# Patient Record
Sex: Female | Born: 1935 | Race: White | Hispanic: No | Smoking: Never smoker
Health system: Southern US, Community
[De-identification: ages and names within clinical notes are randomized; demographics above are authoritative.]

## PROBLEM LIST (undated history)

## (undated) DIAGNOSIS — M810 Age-related osteoporosis without current pathological fracture: Secondary | ICD-10-CM

## (undated) DIAGNOSIS — G459 Transient cerebral ischemic attack, unspecified: Secondary | ICD-10-CM

## (undated) DIAGNOSIS — I679 Cerebrovascular disease, unspecified: Secondary | ICD-10-CM

## (undated) DIAGNOSIS — E785 Hyperlipidemia, unspecified: Secondary | ICD-10-CM

## (undated) DIAGNOSIS — T7840XA Allergy, unspecified, initial encounter: Secondary | ICD-10-CM

## (undated) DIAGNOSIS — M1990 Unspecified osteoarthritis, unspecified site: Secondary | ICD-10-CM

## (undated) DIAGNOSIS — R42 Dizziness and giddiness: Secondary | ICD-10-CM

## (undated) DIAGNOSIS — C801 Malignant (primary) neoplasm, unspecified: Secondary | ICD-10-CM

## (undated) DIAGNOSIS — Z972 Presence of dental prosthetic device (complete) (partial): Secondary | ICD-10-CM

## (undated) HISTORY — DX: Allergy, unspecified, initial encounter: T78.40XA

## (undated) HISTORY — DX: Age-related osteoporosis without current pathological fracture: M81.0

## (undated) HISTORY — DX: Hyperlipidemia, unspecified: E78.5

## (undated) HISTORY — PX: COLONOSCOPY: SHX174

## (undated) HISTORY — PX: CATARACT EXTRACTION W/ INTRAOCULAR LENS  IMPLANT, BILATERAL: SHX1307

## (undated) HISTORY — DX: Cerebrovascular disease, unspecified: I67.9

## (undated) HISTORY — PX: TUBAL LIGATION: SHX77

---

## 2004-09-22 ENCOUNTER — Ambulatory Visit: Payer: Self-pay | Admitting: Gastroenterology

## 2004-10-04 ENCOUNTER — Ambulatory Visit: Payer: Self-pay | Admitting: Internal Medicine

## 2005-10-09 ENCOUNTER — Ambulatory Visit: Payer: Self-pay | Admitting: Internal Medicine

## 2006-07-17 ENCOUNTER — Ambulatory Visit: Payer: Self-pay | Admitting: Internal Medicine

## 2006-10-11 ENCOUNTER — Ambulatory Visit: Payer: Self-pay | Admitting: Internal Medicine

## 2007-10-14 ENCOUNTER — Ambulatory Visit: Payer: Self-pay | Admitting: Internal Medicine

## 2008-08-21 ENCOUNTER — Ambulatory Visit: Payer: Self-pay | Admitting: Internal Medicine

## 2008-10-13 ENCOUNTER — Ambulatory Visit: Payer: Self-pay | Admitting: Neurology

## 2008-10-14 ENCOUNTER — Ambulatory Visit: Payer: Self-pay | Admitting: Internal Medicine

## 2009-10-18 ENCOUNTER — Ambulatory Visit: Payer: Self-pay | Admitting: Internal Medicine

## 2010-10-20 ENCOUNTER — Ambulatory Visit: Payer: Self-pay | Admitting: Internal Medicine

## 2011-10-23 ENCOUNTER — Ambulatory Visit: Payer: Self-pay | Admitting: Internal Medicine

## 2012-10-23 ENCOUNTER — Ambulatory Visit: Payer: Self-pay | Admitting: Internal Medicine

## 2013-09-23 DIAGNOSIS — J309 Allergic rhinitis, unspecified: Secondary | ICD-10-CM | POA: Insufficient documentation

## 2013-09-23 DIAGNOSIS — I679 Cerebrovascular disease, unspecified: Secondary | ICD-10-CM | POA: Insufficient documentation

## 2013-10-27 ENCOUNTER — Ambulatory Visit: Payer: Self-pay | Admitting: Family Medicine

## 2014-11-06 DIAGNOSIS — E782 Mixed hyperlipidemia: Secondary | ICD-10-CM | POA: Insufficient documentation

## 2014-11-06 DIAGNOSIS — M810 Age-related osteoporosis without current pathological fracture: Secondary | ICD-10-CM | POA: Insufficient documentation

## 2014-11-09 ENCOUNTER — Other Ambulatory Visit: Payer: Self-pay | Admitting: Family Medicine

## 2014-11-09 DIAGNOSIS — Z1231 Encounter for screening mammogram for malignant neoplasm of breast: Secondary | ICD-10-CM

## 2014-11-10 ENCOUNTER — Ambulatory Visit
Admission: RE | Admit: 2014-11-10 | Discharge: 2014-11-10 | Disposition: A | Discharge status: Home / Self Care | Payer: PPO | Source: Ambulatory Visit | Attending: Family Medicine | Admitting: Family Medicine

## 2014-11-10 ENCOUNTER — Encounter (INDEPENDENT_AMBULATORY_CARE_PROVIDER_SITE_OTHER): Payer: Self-pay

## 2014-11-10 DIAGNOSIS — Z1231 Encounter for screening mammogram for malignant neoplasm of breast: Secondary | ICD-10-CM | POA: Diagnosis present

## 2014-11-23 ENCOUNTER — Ambulatory Visit (INDEPENDENT_AMBULATORY_CARE_PROVIDER_SITE_OTHER): Payer: PPO | Admitting: Podiatry

## 2014-11-23 ENCOUNTER — Encounter: Payer: Self-pay | Admitting: Podiatry

## 2014-11-23 ENCOUNTER — Ambulatory Visit (INDEPENDENT_AMBULATORY_CARE_PROVIDER_SITE_OTHER): Payer: PPO

## 2014-11-23 VITALS — BP 136/71 | HR 73 | Resp 16 | Ht 63.0 in | Wt 133.0 lb

## 2014-11-23 DIAGNOSIS — S92001A Unspecified fracture of right calcaneus, initial encounter for closed fracture: Secondary | ICD-10-CM

## 2014-11-23 DIAGNOSIS — L600 Ingrowing nail: Secondary | ICD-10-CM | POA: Diagnosis not present

## 2014-11-23 DIAGNOSIS — S92301A Fracture of unspecified metatarsal bone(s), right foot, initial encounter for closed fracture: Secondary | ICD-10-CM | POA: Diagnosis not present

## 2014-11-23 MED ORDER — NEOMYCIN-POLYMYXIN-HC 1 % OT SOLN: OTIC | Status: DC

## 2014-11-23 NOTE — Progress Notes (Signed)
   Subjective:    Patient ID: Laura Ramos, female    DOB: 01-31-1933, 81 y.o.   MRN: [id number]  HPI  Did something to my right foot about a week ago Sunday.  The lateral side of foot started to hurt and i could barely walk out of church. It bruised up around the ankle . It is better now . Before that happened the top of my right foot was sore . She is also complaining of a painful ingrown toenail to the tibial border of the hallux left. She states it is been like this for quite some time and a need to have that margin removed.  Review of Systems     Objective:   Physical Exam: I have reviewed her past medical history medications allergies chief complaint past surgical history and review of systems. Pulses are strongly palpable bilateral. Neurologic sensorium is intact per Semmes-Weinstein monofilament. Deep tendon reflexes are intact bilateral and muscle strength +5 over 5 dorsiflexion plantar flexors and inverters everters all intrinsic musculature is intact. Orthopedic evaluation demonstrates ecchymosis and edema overlying the sinus tarsi area of the right foot. Radiographs demonstrate what appears to be a minimally comminuted displaced fracture of the anterior process of the calcaneus right foot. Left foot demonstrates a sharp radial nail margin with erythema and edema to the tibial border of the hallux left. No purulence and no malodor.        Assessment & Plan:  Assessment: Nondisplaced non-comminuted fracture anterior process right calcaneus. Ingrown nail paronychia abscess hallux left.  Plan: Discussed etiology pathology conservative versus surgical therapies. Encouraged her to continue to wear good supportive shoe gear to the right foot. I suggested that she not go barefooted or wear flip flops or sandals. We performed a chemical matrixectomy to the tibial border of the hallux left today. She tolerated this procedure well after local anesthesia was administered. We then split the  nail from distal proximal and avulsed in total 3 applications of phenol were applied to the nailbed and the root. She was given both I will read home-going instructions for the care and soaking of her toe and I will follow-up with her in 1 week for reevaluation.

## 2014-11-23 NOTE — Patient Instructions (Signed)

## 2014-12-02 ENCOUNTER — Ambulatory Visit (INDEPENDENT_AMBULATORY_CARE_PROVIDER_SITE_OTHER): Payer: PPO | Admitting: Podiatry

## 2014-12-02 DIAGNOSIS — S92001A Unspecified fracture of right calcaneus, initial encounter for closed fracture: Secondary | ICD-10-CM

## 2014-12-02 DIAGNOSIS — L600 Ingrowing nail: Secondary | ICD-10-CM

## 2014-12-02 NOTE — Progress Notes (Signed)
She presents today for follow-up of her matrixectomy tibial border hallux left. She states that her right foot seems to be doing much better. She continues to soak twice daily and Betadine and water apply quarter screws for an otic twice daily and cover with a Band-Aid.  Objective: Vital signs are stable she is alert and oriented 3. Pulses are palpable. Surgical toe does not demonstrate any erythema cellulitis drainage or odor. She has minimal tenderness on palpation of the right foot.  Assessment: Well-healed surgical toe left.  Plan: Discontinue Betadine Stow with Epsom salts and warm water soaks covered in the daily open at night.

## 2014-12-02 NOTE — Patient Instructions (Signed)

## 2014-12-23 ENCOUNTER — Ambulatory Visit: Payer: PPO | Admitting: Podiatry

## 2015-05-04 DIAGNOSIS — Z79899 Other long term (current) drug therapy: Secondary | ICD-10-CM | POA: Diagnosis not present

## 2015-05-04 DIAGNOSIS — E7800 Pure hypercholesterolemia, unspecified: Secondary | ICD-10-CM | POA: Diagnosis not present

## 2015-05-13 DIAGNOSIS — J302 Other seasonal allergic rhinitis: Secondary | ICD-10-CM | POA: Diagnosis not present

## 2015-05-13 DIAGNOSIS — Z79899 Other long term (current) drug therapy: Secondary | ICD-10-CM | POA: Diagnosis not present

## 2015-05-13 DIAGNOSIS — E7800 Pure hypercholesterolemia, unspecified: Secondary | ICD-10-CM | POA: Diagnosis not present

## 2015-05-13 DIAGNOSIS — I679 Cerebrovascular disease, unspecified: Secondary | ICD-10-CM | POA: Diagnosis not present

## 2015-07-01 DIAGNOSIS — C44629 Squamous cell carcinoma of skin of left upper limb, including shoulder: Secondary | ICD-10-CM | POA: Diagnosis not present

## 2015-07-01 DIAGNOSIS — L570 Actinic keratosis: Secondary | ICD-10-CM | POA: Diagnosis not present

## 2015-07-01 DIAGNOSIS — C44622 Squamous cell carcinoma of skin of right upper limb, including shoulder: Secondary | ICD-10-CM | POA: Diagnosis not present

## 2015-08-05 DIAGNOSIS — L570 Actinic keratosis: Secondary | ICD-10-CM | POA: Diagnosis not present

## 2015-08-05 DIAGNOSIS — D0462 Carcinoma in situ of skin of left upper limb, including shoulder: Secondary | ICD-10-CM | POA: Diagnosis not present

## 2015-08-05 DIAGNOSIS — D485 Neoplasm of uncertain behavior of skin: Secondary | ICD-10-CM | POA: Diagnosis not present

## 2015-08-05 DIAGNOSIS — X32XXXA Exposure to sunlight, initial encounter: Secondary | ICD-10-CM | POA: Diagnosis not present

## 2015-08-11 DIAGNOSIS — R399 Unspecified symptoms and signs involving the genitourinary system: Secondary | ICD-10-CM | POA: Diagnosis not present

## 2015-08-18 DIAGNOSIS — H04123 Dry eye syndrome of bilateral lacrimal glands: Secondary | ICD-10-CM | POA: Diagnosis not present

## 2015-09-09 DIAGNOSIS — L905 Scar conditions and fibrosis of skin: Secondary | ICD-10-CM | POA: Diagnosis not present

## 2015-09-09 DIAGNOSIS — D0462 Carcinoma in situ of skin of left upper limb, including shoulder: Secondary | ICD-10-CM | POA: Diagnosis not present

## 2015-09-23 DIAGNOSIS — W57XXXA Bitten or stung by nonvenomous insect and other nonvenomous arthropods, initial encounter: Secondary | ICD-10-CM | POA: Diagnosis not present

## 2015-09-23 DIAGNOSIS — S70362A Insect bite (nonvenomous), left thigh, initial encounter: Secondary | ICD-10-CM | POA: Diagnosis not present

## 2015-09-23 DIAGNOSIS — S20162A Insect bite (nonvenomous) of breast, left breast, initial encounter: Secondary | ICD-10-CM | POA: Diagnosis not present

## 2015-10-07 DIAGNOSIS — R509 Fever, unspecified: Secondary | ICD-10-CM | POA: Diagnosis not present

## 2015-10-07 DIAGNOSIS — W57XXXD Bitten or stung by nonvenomous insect and other nonvenomous arthropods, subsequent encounter: Secondary | ICD-10-CM | POA: Diagnosis not present

## 2015-10-07 DIAGNOSIS — S20162D Insect bite (nonvenomous) of breast, left breast, subsequent encounter: Secondary | ICD-10-CM | POA: Diagnosis not present

## 2015-11-01 DIAGNOSIS — E7800 Pure hypercholesterolemia, unspecified: Secondary | ICD-10-CM | POA: Diagnosis not present

## 2015-11-01 DIAGNOSIS — Z79899 Other long term (current) drug therapy: Secondary | ICD-10-CM | POA: Diagnosis not present

## 2015-11-08 DIAGNOSIS — Z79899 Other long term (current) drug therapy: Secondary | ICD-10-CM | POA: Diagnosis not present

## 2015-11-08 DIAGNOSIS — Z0000 Encounter for general adult medical examination without abnormal findings: Secondary | ICD-10-CM | POA: Diagnosis not present

## 2015-11-08 DIAGNOSIS — M810 Age-related osteoporosis without current pathological fracture: Secondary | ICD-10-CM | POA: Diagnosis not present

## 2015-11-08 DIAGNOSIS — E7800 Pure hypercholesterolemia, unspecified: Secondary | ICD-10-CM | POA: Diagnosis not present

## 2015-11-09 ENCOUNTER — Other Ambulatory Visit: Payer: Self-pay | Admitting: Family Medicine

## 2015-11-09 DIAGNOSIS — Z1231 Encounter for screening mammogram for malignant neoplasm of breast: Secondary | ICD-10-CM

## 2015-11-15 DIAGNOSIS — M810 Age-related osteoporosis without current pathological fracture: Secondary | ICD-10-CM | POA: Diagnosis not present

## 2015-11-24 ENCOUNTER — Other Ambulatory Visit: Payer: Self-pay | Admitting: Family Medicine

## 2015-11-24 ENCOUNTER — Ambulatory Visit
Admission: RE | Admit: 2015-11-24 | Discharge: 2015-11-24 | Disposition: A | Discharge status: Home / Self Care | Payer: PPO | Source: Ambulatory Visit | Attending: Family Medicine | Admitting: Family Medicine

## 2015-11-24 DIAGNOSIS — Z1231 Encounter for screening mammogram for malignant neoplasm of breast: Secondary | ICD-10-CM | POA: Insufficient documentation

## 2015-11-24 HISTORY — DX: Malignant (primary) neoplasm, unspecified: C80.1

## 2015-12-03 DIAGNOSIS — X32XXXA Exposure to sunlight, initial encounter: Secondary | ICD-10-CM | POA: Diagnosis not present

## 2015-12-03 DIAGNOSIS — L570 Actinic keratosis: Secondary | ICD-10-CM | POA: Diagnosis not present

## 2015-12-03 DIAGNOSIS — Z85828 Personal history of other malignant neoplasm of skin: Secondary | ICD-10-CM | POA: Diagnosis not present

## 2015-12-03 DIAGNOSIS — L821 Other seborrheic keratosis: Secondary | ICD-10-CM | POA: Diagnosis not present

## 2015-12-03 DIAGNOSIS — Z08 Encounter for follow-up examination after completed treatment for malignant neoplasm: Secondary | ICD-10-CM | POA: Diagnosis not present

## 2016-02-29 DIAGNOSIS — M5440 Lumbago with sciatica, unspecified side: Secondary | ICD-10-CM | POA: Diagnosis not present

## 2016-05-01 DIAGNOSIS — E7800 Pure hypercholesterolemia, unspecified: Secondary | ICD-10-CM | POA: Diagnosis not present

## 2016-05-01 DIAGNOSIS — Z79899 Other long term (current) drug therapy: Secondary | ICD-10-CM | POA: Diagnosis not present

## 2016-05-16 DIAGNOSIS — I679 Cerebrovascular disease, unspecified: Secondary | ICD-10-CM | POA: Diagnosis not present

## 2016-05-16 DIAGNOSIS — E7800 Pure hypercholesterolemia, unspecified: Secondary | ICD-10-CM | POA: Diagnosis not present

## 2016-05-16 DIAGNOSIS — G8929 Other chronic pain: Secondary | ICD-10-CM | POA: Diagnosis not present

## 2016-05-16 DIAGNOSIS — M5441 Lumbago with sciatica, right side: Secondary | ICD-10-CM | POA: Diagnosis not present

## 2016-05-16 DIAGNOSIS — Z79899 Other long term (current) drug therapy: Secondary | ICD-10-CM | POA: Diagnosis not present

## 2016-07-14 DIAGNOSIS — J069 Acute upper respiratory infection, unspecified: Secondary | ICD-10-CM | POA: Diagnosis not present

## 2016-08-28 ENCOUNTER — Ambulatory Visit (INDEPENDENT_AMBULATORY_CARE_PROVIDER_SITE_OTHER): Payer: PPO | Admitting: Podiatry

## 2016-08-28 ENCOUNTER — Ambulatory Visit (INDEPENDENT_AMBULATORY_CARE_PROVIDER_SITE_OTHER): Payer: PPO

## 2016-08-28 ENCOUNTER — Encounter: Payer: Self-pay | Admitting: Podiatry

## 2016-08-28 DIAGNOSIS — M7752 Other enthesopathy of left foot: Secondary | ICD-10-CM

## 2016-08-28 DIAGNOSIS — M779 Enthesopathy, unspecified: Principal | ICD-10-CM

## 2016-08-28 DIAGNOSIS — M778 Other enthesopathies, not elsewhere classified: Secondary | ICD-10-CM

## 2016-08-28 DIAGNOSIS — Q828 Other specified congenital malformations of skin: Secondary | ICD-10-CM

## 2016-08-28 NOTE — Progress Notes (Signed)
She presents today chief complaint of painful lesion plantar aspect left foot. She states that he'll not under here and it may be a neuroma. She states it is been painful for a while and different shoes really have failed to alleviate her symptoms.  Objective: Vital signs are stable she's alert and 3 pulses are palpable. Protective hyperkeratotic subsecond is present but nonpainful. I debrided this for her today. I also am able to palpate the lesser metatarsals particularly #3 and number for the left foot are exquisitely tender.  Assessment: Metatarsalgia and porokeratosis.  Plan: Demonstrated to her thoroughly today how to place padding in her shoes to help prevent and offload the pressure of the second metatarsal third and fourth metatarsals. He will use arch pads/met pads.

## 2016-08-30 DIAGNOSIS — H40003 Preglaucoma, unspecified, bilateral: Secondary | ICD-10-CM | POA: Diagnosis not present

## 2016-10-30 DIAGNOSIS — H40003 Preglaucoma, unspecified, bilateral: Secondary | ICD-10-CM | POA: Diagnosis not present

## 2016-11-06 DIAGNOSIS — Z79899 Other long term (current) drug therapy: Secondary | ICD-10-CM | POA: Diagnosis not present

## 2016-11-06 DIAGNOSIS — E7800 Pure hypercholesterolemia, unspecified: Secondary | ICD-10-CM | POA: Diagnosis not present

## 2016-11-06 DIAGNOSIS — H40003 Preglaucoma, unspecified, bilateral: Secondary | ICD-10-CM | POA: Diagnosis not present

## 2016-11-16 DIAGNOSIS — I679 Cerebrovascular disease, unspecified: Secondary | ICD-10-CM | POA: Diagnosis not present

## 2016-11-16 DIAGNOSIS — Z79899 Other long term (current) drug therapy: Secondary | ICD-10-CM | POA: Diagnosis not present

## 2016-11-16 DIAGNOSIS — E7800 Pure hypercholesterolemia, unspecified: Secondary | ICD-10-CM | POA: Diagnosis not present

## 2016-11-16 DIAGNOSIS — J309 Allergic rhinitis, unspecified: Secondary | ICD-10-CM | POA: Diagnosis not present

## 2016-11-30 DIAGNOSIS — Z08 Encounter for follow-up examination after completed treatment for malignant neoplasm: Secondary | ICD-10-CM | POA: Diagnosis not present

## 2016-11-30 DIAGNOSIS — X32XXXA Exposure to sunlight, initial encounter: Secondary | ICD-10-CM | POA: Diagnosis not present

## 2016-11-30 DIAGNOSIS — H02403 Unspecified ptosis of bilateral eyelids: Secondary | ICD-10-CM | POA: Diagnosis not present

## 2016-11-30 DIAGNOSIS — L570 Actinic keratosis: Secondary | ICD-10-CM | POA: Diagnosis not present

## 2016-11-30 DIAGNOSIS — Z85828 Personal history of other malignant neoplasm of skin: Secondary | ICD-10-CM | POA: Diagnosis not present

## 2016-11-30 DIAGNOSIS — L821 Other seborrheic keratosis: Secondary | ICD-10-CM | POA: Diagnosis not present

## 2017-01-04 DIAGNOSIS — W57XXXA Bitten or stung by nonvenomous insect and other nonvenomous arthropods, initial encounter: Secondary | ICD-10-CM | POA: Diagnosis not present

## 2017-01-04 DIAGNOSIS — G44209 Tension-type headache, unspecified, not intractable: Secondary | ICD-10-CM | POA: Diagnosis not present

## 2017-01-04 DIAGNOSIS — L539 Erythematous condition, unspecified: Secondary | ICD-10-CM | POA: Diagnosis not present

## 2017-01-22 DIAGNOSIS — A6929 Other conditions associated with Lyme disease: Secondary | ICD-10-CM | POA: Diagnosis not present

## 2017-04-03 ENCOUNTER — Other Ambulatory Visit: Payer: Self-pay

## 2017-04-03 ENCOUNTER — Encounter: Payer: Self-pay | Admitting: *Deleted

## 2017-04-05 NOTE — Discharge Instructions (Signed)
INSTRUCTIONS FOLLOWING OCULOPLASTIC SURGERY °AMY M. FOWLER, MD ° °AFTER YOUR EYE SURGERY, THER ARE MANY THINGS THWIHC YOU, THE PATIENT, CAN DO TO ASSURE THE BEST POSSIBLE RESULT FROM YOUR OPERATION.  THIS SHEET SHOULD BE REFERRED TO WHENEVER QUESTIONS ARISE.  IF THERE ARE ANY QUESTIONS NOT ANSWERED HERE, DO NOT HESITATE TO CALL OUR OFFICE AT [phone] OR [phone].  THERE IS ALWAYS [facility] AVAILABLE TO CALL IF QUESTIONS OR PROBLEMS ARISE. ° °VISION: Your vision may be blurred and out of focus after surgery until you are able to stop using your ointment, swelling resolves and your eye(s) heal. This may take 1 to 2 weeks at the least.  If your vision becomes gradually more dim or dark, this is not normal and you need to call our office immediately. ° °EYE CARE: For the first 48 hours after surgery, use ice packs frequently - “20 minutes on, 20 minutes off” - to help reduce swelling and bruising.  Small bags of frozen peas or corn make good ice packs along with cloths soaked in ice water.  If you are wearing a patch or other type of dressing following surgery, keep this on for the amount of time specified by your doctor.  For the first week following surgery, you will need to treat your stitches with great care.  If is OK to shower, but take care to not allow soapy water to run into your eye(s) to help reduce changes of infection.  You may gently clean the eyelashes and around the eye(s) with cotton balls and sterile water, BUT DO NOT RUB THE STITCHES VIGOROUSLY.  Keeping your stitches moist with ointment will help promote healing with minimal scar formation. ° °ACTIVITY: When you leave the surgery center, you should go home, rest and be inactive.  The eye(s) may feel scratchy and keeping the eyes closed will allow for faster healing.  The first week following surgery, avoid straining (anything making the face turn red) or lifting over 20 pounds.  Additionally, avoid bending which causes your head to go below  your waist.  Using your eyes will NOT harm them, so feel free to read, watch television, use the computer, etc as desired.  Driving depends on each individual, so check with your doctor if you have questions about driving. ° °MEDICATIONS:  You will be given a prescription for an ointment to use 4 times a day on your stitches.  You can use the ointment in your eyes if they feel scratchy or irritated.  If you eyelid(s) don’t close completely when you sleep, put some ointment in your eyes before bedtime. ° °EMERGENCY: If you experience SEVERE EYE PAIN OR HEADACHE UNRELIEVED BY TYLENOL OR PERCOCET, NAUSEA OR VOMITING, WORSENING REDNESS, OR WORSENING VISION (ESPECIALLY VISION THAT WA INITIALLY BETTER) CALL [phone] OR [phone] DURING BUSINESS HOURS OR AFTER HOURS. ° °General Anesthesia, Adult, Care After °These instructions provide you with information about caring for yourself after your procedure. Your health care provider may also give you more specific instructions. Your treatment has been planned according to current medical practices, but problems sometimes occur. Call your health care provider if you have any problems or questions after your procedure. °What can I expect after the procedure? °After the procedure, it is common to have: °· Vomiting. °· A sore throat. °· Mental slowness. ° °It is common to feel: °· Nauseous. °· Cold or shivery. °· Sleepy. °· Tired. °· Sore or achy, even in parts of your body where you did not have surgery. ° °  Follow these instructions at home: °For at least 24 hours after the procedure: °· Do not: °? Participate in activities where you could fall or become injured. °? Drive. °? Use heavy machinery. °? Drink alcohol. °? Take sleeping pills or medicines that cause drowsiness. °? Make important decisions or sign legal documents. °? Take care of children on your own. °· Rest. °Eating and drinking °· If you vomit, drink water, juice, or soup when you can drink without  vomiting. °· Drink enough fluid to keep your urine clear or pale yellow. °· Make sure you have little or no nausea before eating solid foods. °· Follow the diet recommended by your health care provider. °General instructions °· Have a responsible adult stay with you until you are awake and alert. °· Return to your normal activities as told by your health care provider. Ask your health care provider what activities are safe for you. °· Take over-the-counter and prescription medicines only as told by your health care provider. °· If you smoke, do not smoke without supervision. °· Keep all follow-up visits as told by your health care provider. This is important. °Contact a health care provider if: °· You continue to have nausea or vomiting at home, and medicines are not helpful. °· You cannot drink fluids or start eating again. °· You cannot urinate after 8-12 hours. °· You develop a skin rash. °· You have fever. °· You have increasing redness at the site of your procedure. °Get help right away if: °· You have difficulty breathing. °· You have chest pain. °· You have unexpected bleeding. °· You feel that you are having a life-threatening or urgent problem. °This information is not intended to replace advice given to you by your health care provider. Make sure you discuss any questions you have with your health care provider. °Document Released: 07/17/2000 Document Revised: 09/13/2015 Document Reviewed: 03/25/2015 °Elsevier Interactive Patient Education © 2018 [employer] ° °

## 2017-04-10 ENCOUNTER — Ambulatory Visit: Payer: PPO | Admitting: Anesthesiology

## 2017-04-10 ENCOUNTER — Ambulatory Visit
Admission: RE | Admit: 2017-04-10 | Discharge: 2017-04-10 | Disposition: A | Discharge status: Home / Self Care | Payer: PPO | Source: Ambulatory Visit | Attending: Ophthalmology | Admitting: Ophthalmology

## 2017-04-10 ENCOUNTER — Encounter
Admission: RE | Disposition: A | Discharge status: Home / Self Care | Payer: Self-pay | Source: Ambulatory Visit | Attending: Ophthalmology

## 2017-04-10 DIAGNOSIS — H02403 Unspecified ptosis of bilateral eyelids: Secondary | ICD-10-CM | POA: Insufficient documentation

## 2017-04-10 DIAGNOSIS — Z7951 Long term (current) use of inhaled steroids: Secondary | ICD-10-CM | POA: Diagnosis not present

## 2017-04-10 DIAGNOSIS — Z8673 Personal history of transient ischemic attack (TIA), and cerebral infarction without residual deficits: Secondary | ICD-10-CM | POA: Diagnosis not present

## 2017-04-10 DIAGNOSIS — M810 Age-related osteoporosis without current pathological fracture: Secondary | ICD-10-CM | POA: Insufficient documentation

## 2017-04-10 DIAGNOSIS — H0234 Blepharochalasis left upper eyelid: Secondary | ICD-10-CM | POA: Diagnosis not present

## 2017-04-10 DIAGNOSIS — Z85828 Personal history of other malignant neoplasm of skin: Secondary | ICD-10-CM | POA: Insufficient documentation

## 2017-04-10 DIAGNOSIS — E7800 Pure hypercholesterolemia, unspecified: Secondary | ICD-10-CM | POA: Insufficient documentation

## 2017-04-10 DIAGNOSIS — Z7902 Long term (current) use of antithrombotics/antiplatelets: Secondary | ICD-10-CM | POA: Insufficient documentation

## 2017-04-10 DIAGNOSIS — Z79899 Other long term (current) drug therapy: Secondary | ICD-10-CM | POA: Diagnosis not present

## 2017-04-10 DIAGNOSIS — H0231 Blepharochalasis right upper eyelid: Secondary | ICD-10-CM | POA: Diagnosis not present

## 2017-04-10 HISTORY — DX: Transient cerebral ischemic attack, unspecified: G45.9

## 2017-04-10 HISTORY — DX: Presence of dental prosthetic device (complete) (partial): Z97.2

## 2017-04-10 HISTORY — PX: PTOSIS REPAIR: SHX6568

## 2017-04-10 HISTORY — DX: Dizziness and giddiness: R42

## 2017-04-10 HISTORY — DX: Unspecified osteoarthritis, unspecified site: M19.90

## 2017-04-10 SURGERY — REPAIR, BLEPHAROPTOSIS
Anesthesia: Monitor Anesthesia Care | Site: Eye | Laterality: Bilateral | Wound class: Clean

## 2017-04-10 MED ORDER — ALFENTANIL 500 MCG/ML IJ INJ
INJECTION | INTRAVENOUS | Status: DC | PRN
  Administered 2017-04-10: 200 ug via INTRAVENOUS
  Administered 2017-04-10: 500 ug via INTRAVENOUS

## 2017-04-10 MED ORDER — MIDAZOLAM HCL 2 MG/2ML IJ SOLN
INTRAMUSCULAR | Status: DC | PRN
  Administered 2017-04-10: 2 mg via INTRAVENOUS

## 2017-04-10 MED ORDER — BSS IO SOLN
INTRAOCULAR | Status: DC | PRN
  Administered 2017-04-10: 15 mL

## 2017-04-10 MED ORDER — ERYTHROMYCIN 5 MG/GM OP OINT
TOPICAL_OINTMENT | OPHTHALMIC | 3 refills | Status: DC
Start: 2017-04-10 — End: 2022-06-21

## 2017-04-10 MED ORDER — ERYTHROMYCIN 5 MG/GM OP OINT
TOPICAL_OINTMENT | OPHTHALMIC | Status: DC | PRN
  Administered 2017-04-10: 1 via OPHTHALMIC

## 2017-04-10 MED ORDER — TETRACAINE HCL 0.5 % OP SOLN
OPHTHALMIC | Status: DC | PRN
  Administered 2017-04-10: 2 [drp] via OPHTHALMIC

## 2017-04-10 MED ORDER — LACTATED RINGERS IV SOLN
INTRAVENOUS | Status: DC
  Administered 2017-04-10: 11:00:00 via INTRAVENOUS

## 2017-04-10 MED ORDER — LIDOCAINE-EPINEPHRINE 2 %-1:100000 IJ SOLN
INTRAMUSCULAR | Status: DC | PRN
  Administered 2017-04-10: 2 mL via OPHTHALMIC

## 2017-04-10 MED ORDER — TRAMADOL HCL 50 MG PO TABS: ORAL_TABLET | ORAL | 0 refills | Status: DC

## 2017-04-10 SURGICAL SUPPLY — 26 items
APPLICATOR COTTON TIP WD 3 STR (MISCELLANEOUS) ×6 IMPLANT
BLADE SURG 15 STRL LF DISP TIS (BLADE) ×1 IMPLANT
BLADE SURG 15 STRL SS (BLADE) ×2
CORD BIP STRL DISP 12FT (MISCELLANEOUS) ×3 IMPLANT
DRAPE HEAD BAR (DRAPES) ×3 IMPLANT
GAUZE SPONGE 4X4 12PLY STRL (GAUZE/BANDAGES/DRESSINGS) ×3 IMPLANT
GAUZE SPONGE NON-WVN 2X2 STRL (MISCELLANEOUS) ×10 IMPLANT
GLOVE SURG LX 7.0 MICRO (GLOVE) ×4
GLOVE SURG LX STRL 7.0 MICRO (GLOVE) ×2 IMPLANT
MARKER SKIN XFINE TIP W/RULER (MISCELLANEOUS) ×3 IMPLANT
NEEDLE FILTER BLUNT 18X 1/2SAF (NEEDLE) ×2
NEEDLE FILTER BLUNT 18X1 1/2 (NEEDLE) ×1 IMPLANT
NEEDLE HYPO 30X.5 LL (NEEDLE) ×6 IMPLANT
PACK DRAPE NASAL/ENT (PACKS) ×3 IMPLANT
SOL PREP PVP 2OZ (MISCELLANEOUS) ×3
SOLUTION PREP PVP 2OZ (MISCELLANEOUS) ×1 IMPLANT
SPONGE VERSALON 2X2 STRL (MISCELLANEOUS) ×20
SUT CHROMIC 4-0 (SUTURE) ×2
SUT CHROMIC 4-0 M2 12X2 ARM (SUTURE) ×1
SUT PLAIN GUT (SUTURE) ×6 IMPLANT
SUT PROLENE 5 0 P 3 (SUTURE) ×3 IMPLANT
SUT PROLENE 6 0 P 1 18 (SUTURE) ×3 IMPLANT
SUTURE CHRMC 4-0 M2 12X2 ARM (SUTURE) ×1 IMPLANT
SYR 3ML LL SCALE MARK (SYRINGE) ×3 IMPLANT
SYRINGE 10CC LL (SYRINGE) ×3 IMPLANT
WATER STERILE IRR 250ML POUR (IV SOLUTION) ×3 IMPLANT

## 2017-04-10 NOTE — Op Note (Signed)
Preoperative Diagnosis:  Visually significant blepharoptosis bilateral Upper Eyelid(s)   Postoperative Diagnosis:  Same.  Procedure(s) Performed:   Blepharoptosis repair with levator aponeurosis advancement bilateral Upper Eyelid(s)   Surgeon: Philis Pique. Vickki Muff, Ramos.D.  Assistants: none  Anesthesia: MAC  Specimens: None.  Estimated Blood Loss: Minimal.  Complications: None.  Operative Findings: A single 4 or 5-0 Prolene suture was identified and excised from the left upper lid   Procedure:   Allergies were reviewed and the patient Codeine; Statins; Sulfa antibiotics; Amoxicillin; and Neosporin [neomycin-bacitracin zn-polymyx].   After the risks, benefits, complications and alternatives were discussed with the patient, appropriate informed consent was obtained.  While seated in an upright position and looking in primary gaze, the mid pupillary line was marked on the upper eyelid margins bilaterally. The patient was then brought to the operating suite and reclined supine.  Timeout was conducted and the patient was sedated.  Local anesthetic consisting of a 50-50 mixture of 2% lidocaine with epinephrine and 0.75% bupivacaine with added Hylenex was injected subcutaneously to both upper eyelid(s). After adequate local was instilled, the patient was prepped and draped in the usual sterile fashion for eyelid surgery.   Attention was turned to the upper eyelids. A 21m upper eyelid crease incision line was marked with calipers on both upper eyelid(s).  A pinch test was used to estimate the amount of excess skin to remove and this was marked in standard blepharoplasty style fashion. Attention was turned to the  right upper eyelid. A #15 blade was used to open the premarked incision line. A skin only flap with scar tissue was excised and hemostasis was obtained with bipolar cautery.   Westcott scissors were then used to transect through orbicularis down to the tarsal plate. Epitarsus was  dissected to create a smooth surface to suture to. Dissection was then carried superiorly in the plane between orbicularis and orbital septum. Once the preaponeurotic fat pocket was identified, the orbital septum was opened. This revealed the levator and its aponeurosis.    Attention was then turned to the opposite eyelid where the same procedure was performed in the same manner. Hemostasis was obtained with bipolar cautery throughout.   3 interrupted 6-0 Prolene sutures were then passed partial thickness through the tarsal plates of both upper eyelid(s). These sutures were placed in line with the mid pupillary, medial limbal, and lateral limbal lines. The sutures were fixed to the levator aponeurosis and adjusted until a nice lid height and contour were achieved. Once nice symmetry was achieved, the skin incisions were closed with a running 6-0 fast absorbing plain suture. The patient tolerated the procedure well.  Erythromycin ophthalmic ointment was applied to the incision site(s) followed by ice packs. The patient was taken to the recovery area where she recovered without difficulty.  Post-Op Plan/Instructions:  The patient was instructed to use ice packs frequently for the next 48 hours. She was instructed to use erythromycin ophthalmic ointment on her incisions 4 times a day for the next 12 to 14 days. Shewas given a prescription for Percocet for pain control should Tylenol not be effective. [facility] [place] [facility] or sooner as needed for problems.  Laura Ramos. FVickki Muff Ramos.D. Attending,Ophthalmology

## 2017-04-10 NOTE — H&P (Signed)
See the history and physical completed at [facility] on 03/28/17 and scanned into the chart.

## 2017-04-10 NOTE — Anesthesia Preprocedure Evaluation (Signed)
Anesthesia Evaluation  Patient identified by MRN, date of birth, ID band  Reviewed: NPO status   History of Anesthesia Complications Negative for: history of anesthetic complications  Airway Mallampati: II  TM Distance: >3 FB Neck ROM: full    Dental  (+) Lower Dentures   Pulmonary neg pulmonary ROS,    Pulmonary exam normal        Cardiovascular Exercise Tolerance: Good negative cardio ROS Normal cardiovascular exam     Neuro/Psych TIA (2007)No Residual Symptoms negative psych ROS   GI/Hepatic negative GI ROS, Neg liver ROS,   Endo/Other  negative endocrine ROS  Renal/GU negative Renal ROS  negative genitourinary   Musculoskeletal  (+) Arthritis ,   Abdominal   Peds  Hematology Skin cancer    Anesthesia Other Findings Last plavix: 3 days ago   Reproductive/Obstetrics                             Anesthesia Physical Anesthesia Plan  ASA: II  Anesthesia Plan: MAC   Post-op Pain Management:    Induction:   PONV Risk Score and Plan:   Airway Management Planned:   Additional Equipment:   Intra-op Plan:   Post-operative Plan:   Informed Consent: I have reviewed the patients History and Physical, chart, labs and discussed the procedure including the risks, benefits and alternatives for the proposed anesthesia with the patient or authorized representative who has indicated his/her understanding and acceptance.     Plan Discussed with: CRNA  Anesthesia Plan Comments:         Anesthesia Quick Evaluation

## 2017-04-10 NOTE — Anesthesia Procedure Notes (Signed)
Procedure Name: MAC Date/Time: 04/10/2017 11:04 AM Performed by: Cameron Ali, CRNA Pre-anesthesia Checklist: Patient identified, Emergency Drugs available, Suction available and Patient being monitored Patient Re-evaluated:Patient Re-evaluated prior to induction Oxygen Delivery Method: Nasal cannula

## 2017-04-10 NOTE — Interval H&P Note (Signed)
History and Physical Interval Note:  04/10/2017 10:50 AM  Laura Ramos  has presented today for surgery, with the diagnosis of h02.403 ptosis of eyelid unspecified  The various methods of treatment have been discussed with the patient and family. After consideration of risks, benefits and other options for treatment, the patient has consented to  Procedure(s): BLEPHAROPTOSIS REPAIR RESECT EX (Bilateral) as a surgical intervention .  The patient's history has been reviewed, patient examined, no change in status, stable for surgery.  I have reviewed the patient's chart and labs.  Questions were answered to the patient's satisfaction.     Vickki Muff, Markela Wee M

## 2017-04-10 NOTE — Anesthesia Postprocedure Evaluation (Signed)
Anesthesia Post Note  Patient: Laura Ramos  Procedure(s) Performed: BLEPHAROPTOSIS REPAIR RESECT EX (Bilateral Eye)  Patient location during evaluation: PACU Anesthesia Type: MAC Level of consciousness: awake and alert Pain management: pain level controlled Vital Signs Assessment: post-procedure vital signs reviewed and stable Respiratory status: spontaneous breathing, nonlabored ventilation, respiratory function stable and patient connected to nasal cannula oxygen Cardiovascular status: stable and blood pressure returned to baseline Postop Assessment: no apparent nausea or vomiting Anesthetic complications: no    Claris Pech

## 2017-04-10 NOTE — Transfer of Care (Signed)
Immediate Anesthesia Transfer of Care Note  Patient: Laura Ramos  Procedure(s) Performed: BLEPHAROPTOSIS REPAIR RESECT EX (Bilateral Eye)  Patient Location: PACU  Anesthesia Type: MAC  Level of Consciousness: awake, alert  and patient cooperative  Airway and Oxygen Therapy: Patient Spontanous Breathing and Patient connected to supplemental oxygen  Post-op Assessment: Post-op Vital signs reviewed, Patient's Cardiovascular Status Stable, Respiratory Function Stable, Patent Airway and No signs of Nausea or vomiting  Post-op Vital Signs: Reviewed and stable  Complications: No apparent anesthesia complications

## 2017-04-11 ENCOUNTER — Encounter: Payer: Self-pay | Admitting: Ophthalmology

## 2017-05-15 DIAGNOSIS — E7800 Pure hypercholesterolemia, unspecified: Secondary | ICD-10-CM | POA: Diagnosis not present

## 2017-05-15 DIAGNOSIS — Z79899 Other long term (current) drug therapy: Secondary | ICD-10-CM | POA: Diagnosis not present

## 2017-05-22 DIAGNOSIS — Z0000 Encounter for general adult medical examination without abnormal findings: Secondary | ICD-10-CM | POA: Diagnosis not present

## 2017-05-22 DIAGNOSIS — Z79899 Other long term (current) drug therapy: Secondary | ICD-10-CM | POA: Diagnosis not present

## 2017-05-22 DIAGNOSIS — E7800 Pure hypercholesterolemia, unspecified: Secondary | ICD-10-CM | POA: Diagnosis not present

## 2017-06-14 DIAGNOSIS — H40003 Preglaucoma, unspecified, bilateral: Secondary | ICD-10-CM | POA: Diagnosis not present

## 2017-08-16 DIAGNOSIS — H0015 Chalazion left lower eyelid: Secondary | ICD-10-CM | POA: Diagnosis not present

## 2017-10-18 DIAGNOSIS — C44319 Basal cell carcinoma of skin of other parts of face: Secondary | ICD-10-CM | POA: Diagnosis not present

## 2017-10-18 DIAGNOSIS — L821 Other seborrheic keratosis: Secondary | ICD-10-CM | POA: Diagnosis not present

## 2017-10-18 DIAGNOSIS — D485 Neoplasm of uncertain behavior of skin: Secondary | ICD-10-CM | POA: Diagnosis not present

## 2017-10-18 DIAGNOSIS — Z85828 Personal history of other malignant neoplasm of skin: Secondary | ICD-10-CM | POA: Diagnosis not present

## 2017-10-18 DIAGNOSIS — Z08 Encounter for follow-up examination after completed treatment for malignant neoplasm: Secondary | ICD-10-CM | POA: Diagnosis not present

## 2017-11-12 DIAGNOSIS — Z79899 Other long term (current) drug therapy: Secondary | ICD-10-CM | POA: Diagnosis not present

## 2017-11-12 DIAGNOSIS — E7800 Pure hypercholesterolemia, unspecified: Secondary | ICD-10-CM | POA: Diagnosis not present

## 2017-11-19 DIAGNOSIS — M810 Age-related osteoporosis without current pathological fracture: Secondary | ICD-10-CM | POA: Diagnosis not present

## 2017-11-19 DIAGNOSIS — J309 Allergic rhinitis, unspecified: Secondary | ICD-10-CM | POA: Diagnosis not present

## 2017-11-19 DIAGNOSIS — Z79899 Other long term (current) drug therapy: Secondary | ICD-10-CM | POA: Diagnosis not present

## 2017-11-19 DIAGNOSIS — E7800 Pure hypercholesterolemia, unspecified: Secondary | ICD-10-CM | POA: Diagnosis not present

## 2017-11-19 DIAGNOSIS — I679 Cerebrovascular disease, unspecified: Secondary | ICD-10-CM | POA: Diagnosis not present

## 2017-11-22 DIAGNOSIS — C44319 Basal cell carcinoma of skin of other parts of face: Secondary | ICD-10-CM | POA: Diagnosis not present

## 2017-11-26 DIAGNOSIS — M810 Age-related osteoporosis without current pathological fracture: Secondary | ICD-10-CM | POA: Diagnosis not present

## 2017-11-29 DIAGNOSIS — Z4802 Encounter for removal of sutures: Secondary | ICD-10-CM | POA: Diagnosis not present

## 2017-12-13 DIAGNOSIS — H40003 Preglaucoma, unspecified, bilateral: Secondary | ICD-10-CM | POA: Diagnosis not present

## 2017-12-20 DIAGNOSIS — H40003 Preglaucoma, unspecified, bilateral: Secondary | ICD-10-CM | POA: Diagnosis not present

## 2017-12-20 DIAGNOSIS — S80261A Insect bite (nonvenomous), right knee, initial encounter: Secondary | ICD-10-CM | POA: Diagnosis not present

## 2017-12-20 DIAGNOSIS — S0083XA Contusion of other part of head, initial encounter: Secondary | ICD-10-CM | POA: Diagnosis not present

## 2018-01-31 ENCOUNTER — Other Ambulatory Visit: Payer: Self-pay | Admitting: Family Medicine

## 2018-01-31 DIAGNOSIS — Z1231 Encounter for screening mammogram for malignant neoplasm of breast: Secondary | ICD-10-CM

## 2018-02-12 ENCOUNTER — Ambulatory Visit
Admission: RE | Admit: 2018-02-12 | Discharge: 2018-02-12 | Disposition: A | Discharge status: Home / Self Care | Payer: PPO | Source: Ambulatory Visit | Attending: Family Medicine | Admitting: Family Medicine

## 2018-02-12 DIAGNOSIS — Z1231 Encounter for screening mammogram for malignant neoplasm of breast: Secondary | ICD-10-CM | POA: Diagnosis not present

## 2018-05-20 DIAGNOSIS — Z79899 Other long term (current) drug therapy: Secondary | ICD-10-CM | POA: Diagnosis not present

## 2018-05-20 DIAGNOSIS — E7800 Pure hypercholesterolemia, unspecified: Secondary | ICD-10-CM | POA: Diagnosis not present

## 2018-05-23 DIAGNOSIS — Z0000 Encounter for general adult medical examination without abnormal findings: Secondary | ICD-10-CM | POA: Diagnosis not present

## 2018-06-13 ENCOUNTER — Other Ambulatory Visit (HOSPITAL_COMMUNITY): Payer: Self-pay | Admitting: Family Medicine

## 2018-06-13 ENCOUNTER — Other Ambulatory Visit: Payer: Self-pay | Admitting: Family Medicine

## 2018-06-13 DIAGNOSIS — R1031 Right lower quadrant pain: Secondary | ICD-10-CM

## 2018-06-18 ENCOUNTER — Ambulatory Visit
Admission: RE | Admit: 2018-06-18 | Discharge: 2018-06-18 | Disposition: A | Discharge status: Home / Self Care | Payer: PPO | Source: Ambulatory Visit | Attending: Family Medicine | Admitting: Family Medicine

## 2018-06-18 ENCOUNTER — Other Ambulatory Visit: Payer: Self-pay | Admitting: Family Medicine

## 2018-06-18 DIAGNOSIS — R1031 Right lower quadrant pain: Secondary | ICD-10-CM

## 2018-06-21 DIAGNOSIS — H40003 Preglaucoma, unspecified, bilateral: Secondary | ICD-10-CM | POA: Diagnosis not present

## 2018-07-25 DIAGNOSIS — R5383 Other fatigue: Secondary | ICD-10-CM | POA: Diagnosis not present

## 2018-07-25 DIAGNOSIS — R5381 Other malaise: Secondary | ICD-10-CM | POA: Diagnosis not present

## 2018-07-25 DIAGNOSIS — Z79899 Other long term (current) drug therapy: Secondary | ICD-10-CM | POA: Diagnosis not present

## 2018-07-25 DIAGNOSIS — R945 Abnormal results of liver function studies: Secondary | ICD-10-CM | POA: Diagnosis not present

## 2018-11-07 DIAGNOSIS — L03116 Cellulitis of left lower limb: Secondary | ICD-10-CM | POA: Diagnosis not present

## 2018-11-15 DIAGNOSIS — Z79899 Other long term (current) drug therapy: Secondary | ICD-10-CM | POA: Diagnosis not present

## 2018-11-15 DIAGNOSIS — E7800 Pure hypercholesterolemia, unspecified: Secondary | ICD-10-CM | POA: Diagnosis not present

## 2018-11-21 DIAGNOSIS — M810 Age-related osteoporosis without current pathological fracture: Secondary | ICD-10-CM | POA: Diagnosis not present

## 2018-11-21 DIAGNOSIS — E7800 Pure hypercholesterolemia, unspecified: Secondary | ICD-10-CM | POA: Diagnosis not present

## 2018-11-21 DIAGNOSIS — I679 Cerebrovascular disease, unspecified: Secondary | ICD-10-CM | POA: Diagnosis not present

## 2018-11-21 DIAGNOSIS — Z79899 Other long term (current) drug therapy: Secondary | ICD-10-CM | POA: Diagnosis not present

## 2018-12-12 DIAGNOSIS — D2261 Melanocytic nevi of right upper limb, including shoulder: Secondary | ICD-10-CM | POA: Diagnosis not present

## 2018-12-12 DIAGNOSIS — L570 Actinic keratosis: Secondary | ICD-10-CM | POA: Diagnosis not present

## 2018-12-12 DIAGNOSIS — D2272 Melanocytic nevi of left lower limb, including hip: Secondary | ICD-10-CM | POA: Diagnosis not present

## 2018-12-12 DIAGNOSIS — Z85828 Personal history of other malignant neoplasm of skin: Secondary | ICD-10-CM | POA: Diagnosis not present

## 2018-12-12 DIAGNOSIS — T887XXA Unspecified adverse effect of drug or medicament, initial encounter: Secondary | ICD-10-CM | POA: Diagnosis not present

## 2018-12-12 DIAGNOSIS — X32XXXA Exposure to sunlight, initial encounter: Secondary | ICD-10-CM | POA: Diagnosis not present

## 2018-12-12 DIAGNOSIS — D2262 Melanocytic nevi of left upper limb, including shoulder: Secondary | ICD-10-CM | POA: Diagnosis not present

## 2018-12-12 DIAGNOSIS — Z08 Encounter for follow-up examination after completed treatment for malignant neoplasm: Secondary | ICD-10-CM | POA: Diagnosis not present

## 2018-12-18 DIAGNOSIS — H40003 Preglaucoma, unspecified, bilateral: Secondary | ICD-10-CM | POA: Diagnosis not present

## 2018-12-19 DIAGNOSIS — L309 Dermatitis, unspecified: Secondary | ICD-10-CM | POA: Diagnosis not present

## 2018-12-19 DIAGNOSIS — L500 Allergic urticaria: Secondary | ICD-10-CM | POA: Diagnosis not present

## 2018-12-26 DIAGNOSIS — H40003 Preglaucoma, unspecified, bilateral: Secondary | ICD-10-CM | POA: Diagnosis not present

## 2019-03-13 DIAGNOSIS — R81 Glycosuria: Secondary | ICD-10-CM | POA: Diagnosis not present

## 2019-03-13 DIAGNOSIS — R7309 Other abnormal glucose: Secondary | ICD-10-CM | POA: Diagnosis not present

## 2019-03-13 DIAGNOSIS — R300 Dysuria: Secondary | ICD-10-CM | POA: Diagnosis not present

## 2019-03-13 DIAGNOSIS — N390 Urinary tract infection, site not specified: Secondary | ICD-10-CM | POA: Diagnosis not present

## 2019-05-20 DIAGNOSIS — E7800 Pure hypercholesterolemia, unspecified: Secondary | ICD-10-CM | POA: Diagnosis not present

## 2019-05-20 DIAGNOSIS — Z79899 Other long term (current) drug therapy: Secondary | ICD-10-CM | POA: Diagnosis not present

## 2019-05-27 DIAGNOSIS — Z0000 Encounter for general adult medical examination without abnormal findings: Secondary | ICD-10-CM | POA: Diagnosis not present

## 2019-06-26 DIAGNOSIS — H40003 Preglaucoma, unspecified, bilateral: Secondary | ICD-10-CM | POA: Diagnosis not present

## 2019-11-17 ENCOUNTER — Other Ambulatory Visit: Payer: Self-pay | Admitting: Family Medicine

## 2019-11-17 DIAGNOSIS — E782 Mixed hyperlipidemia: Secondary | ICD-10-CM | POA: Diagnosis not present

## 2019-11-17 DIAGNOSIS — Z1231 Encounter for screening mammogram for malignant neoplasm of breast: Secondary | ICD-10-CM

## 2019-11-17 DIAGNOSIS — Z79899 Other long term (current) drug therapy: Secondary | ICD-10-CM | POA: Diagnosis not present

## 2019-11-20 ENCOUNTER — Ambulatory Visit
Admission: RE | Admit: 2019-11-20 | Discharge: 2019-11-20 | Disposition: A | Discharge status: Home / Self Care | Payer: PPO | Source: Ambulatory Visit | Attending: Family Medicine | Admitting: Family Medicine

## 2019-11-20 DIAGNOSIS — Z1231 Encounter for screening mammogram for malignant neoplasm of breast: Secondary | ICD-10-CM | POA: Diagnosis not present

## 2019-11-24 DIAGNOSIS — E7800 Pure hypercholesterolemia, unspecified: Secondary | ICD-10-CM | POA: Diagnosis not present

## 2019-11-24 DIAGNOSIS — M810 Age-related osteoporosis without current pathological fracture: Secondary | ICD-10-CM | POA: Diagnosis not present

## 2019-11-24 DIAGNOSIS — N182 Chronic kidney disease, stage 2 (mild): Secondary | ICD-10-CM | POA: Diagnosis not present

## 2019-11-24 DIAGNOSIS — I679 Cerebrovascular disease, unspecified: Secondary | ICD-10-CM | POA: Diagnosis not present

## 2019-11-24 DIAGNOSIS — Z79899 Other long term (current) drug therapy: Secondary | ICD-10-CM | POA: Diagnosis not present

## 2019-12-05 DIAGNOSIS — M810 Age-related osteoporosis without current pathological fracture: Secondary | ICD-10-CM | POA: Diagnosis not present

## 2019-12-11 DIAGNOSIS — D2262 Melanocytic nevi of left upper limb, including shoulder: Secondary | ICD-10-CM | POA: Diagnosis not present

## 2019-12-11 DIAGNOSIS — D2261 Melanocytic nevi of right upper limb, including shoulder: Secondary | ICD-10-CM | POA: Diagnosis not present

## 2019-12-11 DIAGNOSIS — D225 Melanocytic nevi of trunk: Secondary | ICD-10-CM | POA: Diagnosis not present

## 2019-12-11 DIAGNOSIS — Z85828 Personal history of other malignant neoplasm of skin: Secondary | ICD-10-CM | POA: Diagnosis not present

## 2019-12-11 DIAGNOSIS — L821 Other seborrheic keratosis: Secondary | ICD-10-CM | POA: Diagnosis not present

## 2019-12-11 DIAGNOSIS — D2272 Melanocytic nevi of left lower limb, including hip: Secondary | ICD-10-CM | POA: Diagnosis not present

## 2019-12-19 DIAGNOSIS — S52501A Unspecified fracture of the lower end of right radius, initial encounter for closed fracture: Secondary | ICD-10-CM | POA: Diagnosis not present

## 2019-12-19 DIAGNOSIS — S52591A Other fractures of lower end of right radius, initial encounter for closed fracture: Secondary | ICD-10-CM | POA: Diagnosis not present

## 2019-12-19 DIAGNOSIS — S0990XA Unspecified injury of head, initial encounter: Secondary | ICD-10-CM | POA: Diagnosis not present

## 2019-12-19 DIAGNOSIS — W010XXA Fall on same level from slipping, tripping and stumbling without subsequent striking against object, initial encounter: Secondary | ICD-10-CM | POA: Diagnosis not present

## 2019-12-19 DIAGNOSIS — S6991XA Unspecified injury of right wrist, hand and finger(s), initial encounter: Secondary | ICD-10-CM | POA: Diagnosis not present

## 2019-12-19 DIAGNOSIS — S59291A Other physeal fracture of lower end of radius, right arm, initial encounter for closed fracture: Secondary | ICD-10-CM | POA: Diagnosis not present

## 2019-12-23 DIAGNOSIS — M19049 Primary osteoarthritis, unspecified hand: Secondary | ICD-10-CM | POA: Diagnosis not present

## 2019-12-23 DIAGNOSIS — S52571A Other intraarticular fracture of lower end of right radius, initial encounter for closed fracture: Secondary | ICD-10-CM | POA: Diagnosis not present

## 2019-12-23 DIAGNOSIS — R110 Nausea: Secondary | ICD-10-CM | POA: Diagnosis not present

## 2019-12-30 DIAGNOSIS — R399 Unspecified symptoms and signs involving the genitourinary system: Secondary | ICD-10-CM | POA: Diagnosis not present

## 2019-12-30 DIAGNOSIS — N390 Urinary tract infection, site not specified: Secondary | ICD-10-CM | POA: Diagnosis not present

## 2019-12-31 DIAGNOSIS — N390 Urinary tract infection, site not specified: Secondary | ICD-10-CM | POA: Diagnosis not present

## 2020-01-05 DIAGNOSIS — S52571D Other intraarticular fracture of lower end of right radius, subsequent encounter for closed fracture with routine healing: Secondary | ICD-10-CM | POA: Diagnosis not present

## 2020-01-07 DIAGNOSIS — H40003 Preglaucoma, unspecified, bilateral: Secondary | ICD-10-CM | POA: Diagnosis not present

## 2020-01-16 DIAGNOSIS — S52571D Other intraarticular fracture of lower end of right radius, subsequent encounter for closed fracture with routine healing: Secondary | ICD-10-CM | POA: Diagnosis not present

## 2020-01-16 DIAGNOSIS — W010XXD Fall on same level from slipping, tripping and stumbling without subsequent striking against object, subsequent encounter: Secondary | ICD-10-CM | POA: Diagnosis not present

## 2020-01-21 ENCOUNTER — Ambulatory Visit: Payer: PPO | Attending: Student

## 2020-01-21 ENCOUNTER — Other Ambulatory Visit: Payer: Self-pay

## 2020-01-21 DIAGNOSIS — R296 Repeated falls: Secondary | ICD-10-CM | POA: Diagnosis not present

## 2020-01-21 DIAGNOSIS — R2681 Unsteadiness on feet: Secondary | ICD-10-CM | POA: Diagnosis not present

## 2020-01-21 NOTE — Therapy (Signed)
[facility] [address] [place], [address] Phone: [phone]   Fax:  [phone]  Physical Therapy Evaluation  Patient Details  Name: Laura Ramos MRN: [id number] Date of Birth: [birth year] Referring Provider (PT): Cameron Proud, [place]    Encounter Date: 01/21/2020   PT End of Session - 01/21/20 1538    Visit Number 1    Number of Visits 17    Date for PT Re-Evaluation 03/17/20    Authorization Type [employer]    Authorization Time Period 01/21/20-03/17/20    PT Start Time 1434    PT Stop Time 1534    PT Time Calculation (min) 60 min    Equipment Utilized During Treatment Gait belt    Activity Tolerance Patient tolerated treatment well;No increased pain    Behavior During Therapy WFL for tasks assessed/performed           Past Medical History:  Diagnosis Date  . Allergy   . Artery disease, cerebral   . Arthritis   . Cancer ([place])    skin  . Hyperlipidemia   . Osteoporosis   . TIA (transient ischemic attack)    x2, last apprx 2007. no deficits  . Vertigo    none in several yrs  . Wears dentures    partial lower    Past Surgical History:  Procedure Laterality Date  . CATARACT EXTRACTION W/ INTRAOCULAR LENS  IMPLANT, BILATERAL    . COLONOSCOPY    . PTOSIS REPAIR Bilateral 04/10/2017   Procedure: BLEPHAROPTOSIS REPAIR RESECT EX;  Surgeon: Karle Starch, MD;  Location: [facility];  Service: Ophthalmology;  Laterality: Bilateral;  . TUBAL LIGATION      There were no vitals filed for this visit.    Subjective Assessment - 01/21/20 1537    Subjective Pt presents to OPPT for evaluation of new onset unsteadiness of gait. Pt has been less active, more home bound recently due to COVID-19 infection risk. Pt has sustained 2 falls recently, one resulting in Rt wrist fracture. Fall factors have included uneven ground and dog scenarios. Pt feels less steady than she used to,  whereas she was formerly quite active. Pt feels that most of her balance issues are related to weakness/deconditioning of legs.    How long can you sit comfortably? N/A    How long can you stand comfortably? N/A    How long can you walk comfortably? 25-30 minutes of shopping with heavy fatigue    Currently in Pain? No/denies              [facility] PT Assessment - 01/21/20 0001      Assessment   Medical Diagnosis Unsteadiness on feet; falls     Referring Provider (PT) Cameron Proud, PA-C     Onset Date/Surgical Date --   December 19, 2018   Hand Dominance Right    Next MD Visit --   mid October (pertaining to wrist fracture)      Precautions   Precautions Fall   Wrist fracutre, casted x 4 weeks, now in splint   Required Braces or Orthoses Other Brace/Splint   Right wrist splint at all times (off for showering)      Balance Screen   Has the patient fallen in the past 6 months Yes    How many times? 2    Has the patient had a decrease in activity level because of a fear of falling?  Yes  Is the patient reluctant to leave their home because of a fear of falling?  Yes      Prior Function   Level of Independence Independent with basic ADLs;Independent   Limited community AMB, easily fatigued at this time   Vocation Retired      Engineer, production Intact      Ambulation/Gait   Ambulation/Gait Yes    Gait velocity 1.97m/s    10MWT (self selected, 2 trials)      Balance   Balance Assessed Yes      Standardized Balance Assessment   Standardized Balance Assessment Berg Balance Test      Berg Balance Test   Sit to Stand Able to stand without using hands and stabilize independently    Standing Unsupported Able to stand safely 2 minutes    Sitting with Back Unsupported but Feet Supported on Floor or Stool Able to sit safely and securely 2 minutes    Stand to Sit Sits safely with minimal use of hands    Transfers Able to transfer safely, minor use of hands    Standing  Unsupported with Eyes Closed Able to stand 10 seconds safely    Standing Unsupported with Feet Together Able to place feet together independently and stand 1 minute safely    From Standing, Reach Forward with Outstretched Arm Can reach confidently >25 cm (10")    From Standing Position, Pick up Object from Floor Able to pick up shoe safely and easily    From Standing Position, Turn to Look Behind Over each Shoulder Turn sideways only but maintains balance    Turn 360 Degrees Able to turn 360 degrees safely in 4 seconds or less    Standing Unsupported, Alternately Place Feet on Step/Stool Able to stand independently and safely and complete 8 steps in 20 seconds    Standing Unsupported, One Foot in Front Able to take small step independently and hold 30 seconds    Standing on One Leg Tries to lift leg/unable to hold 3 seconds but remains standing independently    Total Score 49      Functional Gait  Assessment   Gait assessed  Yes    Gait Level Surface Walks 20 ft in less than 5.5 sec, no assistive devices, good speed, no evidence for imbalance, normal gait pattern, deviates no more than 6 in outside of the 12 in walkway width.    Change in Gait Speed Makes only minor adjustments to walking speed, or accomplishes a change in speed with significant gait deviations, deviates 10-15 in outside the 12 in walkway width, or changes speed but loses balance but is able to recover and continue walking.    Gait with Horizontal Head Turns Performs head turns smoothly with slight change in gait velocity (eg, minor disruption to smooth gait path), deviates 6-10 in outside 12 in walkway width, or uses an assistive device.    Gait with Vertical Head Turns Performs head turns with no change in gait. Deviates no more than 6 in outside 12 in walkway width.    Gait and Pivot Turn Pivot turns safely within 3 sec and stops quickly with no loss of balance.    Step Over Obstacle Is able to step over 2 stacked shoe boxes  taped together (9 in total height) without changing gait speed. No evidence of imbalance.    Gait with Narrow Base of Support Ambulates less than 4 steps heel to toe or cannot perform without assistance.  Gait with Eyes Closed Cannot walk 20 ft without assistance, severe gait deviations or imbalance, deviates greater than 15 in outside 12 in walkway width or will not attempt task.    Ambulating Backwards Walks 20 ft, uses assistive device, slower speed, mild gait deviations, deviates 6-10 in outside 12 in walkway width.    Steps Alternating feet, must use rail.    Total Score 19            HEP education/Intervention this date:  -Semitandem stance, chair on one side, plinth on other, supervision level assistance: 6x10sec bilat (pt is a fast counter of seconds, timer encouraged)  -STS hands free from chair + 4 towels (mild knee OA discomfort)  -Single UE supported Bilat heel raise 1x15 *handout provided; safety measures enphasized    Objective measurements completed on examination: See above findings.          PT Short Term Goals - 01/21/20 1546      PT SHORT TERM GOAL #1   Title After 8 weeks pt to demonstrate DGI score improvement to >21/24.    Baseline At eval: 19/24    Time 4    Period Weeks    Status New    Target Date 02/18/20      PT SHORT TERM GOAL #2   Title After 4 weeks patient to demonstrate 5xSTS from standard chair <18sec hands free.    Baseline >25sec at evaluation    Time 4    Period Weeks    Status New    Target Date 02/18/20             PT Long Term Goals - 01/21/20 1548      PT LONG TERM GOAL #1   Title After 8 weeks pt to demonstrate improved BBT score >52/56 to reduce risk of falls.    Baseline 49/56    Time 8    Period Weeks    Status New    Target Date 03/17/20      PT LONG TERM GOAL #2   Title After 8 weeks pt to demonstrate SLS balance >15 seconds bilat    Baseline <3sec bilat    Time 8    Period Weeks    Status New    Target  Date 03/17/20      PT LONG TERM GOAL #3   Title At 8 weeks pt able to 5xSTS: <13sec hands free.    Time 8    Period Weeks    Status New    Target Date 03/17/20      PT LONG TERM GOAL #4   Title After 8 weeks pt to demonstrate 6MWT:>1252ft, no assistive device.    Time 8    Period Weeks    Status New    Target Date 03/17/20                  Plan - 01/21/20 1540    Clinical Impression Statement Pt presenting to OPPT for evaluation of unsteadiness on feet, balance impairment in gait. Examination reveals gross weakness in BLE AEB 5xSTS time and difficulty completing 5 transfers from standard surface height. Pt has mild balance impairment AEB BBT: 49/56, moderate balance imapirment in gait AEB DGI: 19/24. Generally gait speed isappropriate for limited community distance AMB, however pt has very little capacity for gait speed variation, which may play a role in decreased righting responses after LOB at home. Pt will benefit from skilled PT intervention to address impairment/limitations, in order to  reduce falls risk, improve confidence in gait, restore PLOF in participation of IADL/community distance AMB/community fitness programs.    Personal Factors and Comorbidities Age;Fitness;Transportation    Examination-Activity Limitations Bathing;Locomotion Level;Transfers;Squat;Stairs;Toileting    Examination-Participation Restrictions Community Activity;Cleaning;Driving;Laundry    Stability/Clinical Decision Making Stable/Uncomplicated    Clinical Decision Making Low    Rehab Potential Excellent    PT Frequency 2x / week    PT Duration 8 weeks    PT Treatment/Interventions ADLs/Self Care Home Management;Gait training;Stair training;Functional mobility training;Therapeutic activities;Therapeutic exercise;Balance training;Neuromuscular re-education;Patient/family education    PT Next Visit Plan FOTO?; 6MWT, obtain feedback regarding HEP, trial headturns on foam, step-ups, eyes closed on foam      PT Home Exercise Plan Heel raises, elevated STS, semitandem balance    Consulted and Agree with Plan of Care Patient           Patient will benefit from skilled therapeutic intervention in order to improve the following deficits and impairments:  Abnormal gait, Decreased activity tolerance, Decreased balance, Decreased mobility, Decreased range of motion, Difficulty walking, Increased muscle spasms, Decreased safety awareness, Decreased endurance, Decreased strength  Visit Diagnosis: Unsteadiness on feet  Repeated falls     Problem List Patient Active Problem List   Diagnosis Date Noted  . Mixed hyperlipidemia 11/06/2014  . Osteoporosis 11/06/2014  . Allergic rhinitis 09/23/2013  . Cerebrovascular disease 09/23/2013   3:55 PM, 01/21/20 Etta Grandchild, PT, DPT Physical Therapist - [place] [phone] (Office)    Karlena Luebke C 01/21/2020, 3:53 PM  [facility] [address], [place], [address] Phone: [phone]   Fax:  [phone]  Name: Laura Ramos MRN: [id number] Date of Birth: 1933/04/06

## 2020-01-26 ENCOUNTER — Ambulatory Visit: Payer: PPO | Attending: Student | Admitting: Physical Therapy

## 2020-01-26 ENCOUNTER — Encounter: Payer: Self-pay | Admitting: Physical Therapy

## 2020-01-26 ENCOUNTER — Other Ambulatory Visit: Payer: Self-pay

## 2020-01-26 DIAGNOSIS — M79641 Pain in right hand: Secondary | ICD-10-CM | POA: Diagnosis not present

## 2020-01-26 DIAGNOSIS — M25641 Stiffness of right hand, not elsewhere classified: Secondary | ICD-10-CM | POA: Insufficient documentation

## 2020-01-26 DIAGNOSIS — M25631 Stiffness of right wrist, not elsewhere classified: Secondary | ICD-10-CM | POA: Insufficient documentation

## 2020-01-26 DIAGNOSIS — M25531 Pain in right wrist: Secondary | ICD-10-CM | POA: Diagnosis not present

## 2020-01-26 DIAGNOSIS — R296 Repeated falls: Secondary | ICD-10-CM | POA: Diagnosis not present

## 2020-01-26 DIAGNOSIS — M6281 Muscle weakness (generalized): Secondary | ICD-10-CM | POA: Insufficient documentation

## 2020-01-26 DIAGNOSIS — R2681 Unsteadiness on feet: Secondary | ICD-10-CM | POA: Diagnosis not present

## 2020-01-26 NOTE — Therapy (Signed)
[facility] [address], [place], [address] Phone: [phone]   Fax:  [phone]  Physical Therapy Treatment  Patient Details  Name: Laura Ramos MRN: [id number] Date of Birth: 1932-11-23 Referring Provider (PT): Cameron Proud, [place]    Encounter Date: 01/26/2020   PT End of Session - 01/26/20 1305    Visit Number 2    Number of Visits 17    Date for PT Re-Evaluation 03/17/20    Authorization Type [employer]    Authorization Time Period 01/21/20-03/17/20    PT Start Time 0100    PT Stop Time 0140    PT Time Calculation (min) 40 min    Equipment Utilized During Treatment Gait belt    Activity Tolerance Patient tolerated treatment well;No increased pain    Behavior During Therapy WFL for tasks assessed/performed           Past Medical History:  Diagnosis Date   Allergy    Artery disease, cerebral    Arthritis    Cancer ([place])    skin   Hyperlipidemia    Osteoporosis    TIA (transient ischemic attack)    x2, last apprx 2007. no deficits   Vertigo    none in several yrs   Wears dentures    partial lower    Past Surgical History:  Procedure Laterality Date   CATARACT EXTRACTION W/ INTRAOCULAR LENS  IMPLANT, BILATERAL     COLONOSCOPY     PTOSIS REPAIR Bilateral 04/10/2017   Procedure: BLEPHAROPTOSIS REPAIR RESECT EX;  Surgeon: Karle Starch, MD;  Location: [facility];  Service: Ophthalmology;  Laterality: Bilateral;   TUBAL LIGATION      There were no vitals filed for this visit.   Subjective Assessment - 01/26/20 1304    Subjective Patient reports no pain today. Reports no falls since evaluation. Has been completing HEP.    How long can you sit comfortably? N/A    How long can you stand comfortably? N/A    How long can you walk comfortably? 25-30 minutes of shopping with heavy fatigue           Ther-Ex Nustep  Semi tandem 15ec bilat supervision  for safety Full tandem 15sec bilat supervision for safety Bilat heel raises 2x 10 with cuing for eccentric control with good carry over STS from chair with pillow x10; from elevated nustep x10 (reports she cannot complete from chair again, she feels too weak), cuing for proper technique with good carry over [place] outside BOS with narrow BOS x10 tosses; standing on foam x10; on foam with narrow BOS with chair behind and supervision for safety;  Alt lateral step up onto 6in step 3x 10 min cuing for full hip ext with good hip ext activation with good carry over Tandem walking 22ft FWD 73ft BWD x2 with CGA                         PT Education - 01/26/20 1305    Education Details therex form/technique    Person(s) Educated Patient    Methods Explanation;Demonstration;Verbal cues    Comprehension Verbalized understanding;Returned demonstration;Verbal cues required            PT Short Term Goals - 01/21/20 1546      PT SHORT TERM GOAL #1   Title After 8 weeks pt to demonstrate DGI score improvement to >21/24.    Baseline At  eval: 19/24    Time 4    Period Weeks    Status New    Target Date 02/18/20      PT SHORT TERM GOAL #2   Title After 4 weeks patient to demonstrate 5xSTS from standard chair <18sec hands free.    Baseline >25sec at evaluation    Time 4    Period Weeks    Status New    Target Date 02/18/20             PT Long Term Goals - 01/26/20 1342      PT LONG TERM GOAL #1   Title After 8 weeks pt to demonstrate improved BBT score >52/56 to reduce risk of falls.    Baseline 49/56    Time 8    Period Weeks    Status New      PT LONG TERM GOAL #2   Title After 8 weeks pt to demonstrate SLS balance >15 seconds bilat    Baseline <3sec bilat    Time 8    Period Weeks    Status New      PT LONG TERM GOAL #3   Title At 8 weeks pt able to 5xSTS: <13sec hands free.    Time 8    Period Weeks    Status New      PT LONG TERM GOAL #4   Title  Patient will increase FOTO score to 68 to demonstrate predicted increase in functional mobility to complete ADLs    Baseline 01/26/20 55    Time 8    Period Weeks    Status New                 Plan - 01/26/20 1321    Clinical Impression Statement PT reviewed HEP with patient where patient is demonstrating good understanding of technique and safety. PT initiated therex progression for increased strength and balance/proprioception with pt able to demonstrate good carry over of all demonstrations and cuing for proper techniques. PT educated patient on balance strategies with patient able to verbalize understanding. PT will continue progression as able.    Personal Factors and Comorbidities Age;Fitness;Transportation    Examination-Activity Limitations Bathing;Locomotion Level;Transfers;Squat;Stairs;Toileting    Examination-Participation Restrictions Community Activity;Cleaning;Driving;Laundry    Stability/Clinical Decision Making Stable/Uncomplicated    Clinical Decision Making Low    Rehab Potential Excellent    PT Frequency 2x / week    PT Duration 8 weeks    PT Treatment/Interventions ADLs/Self Care Home Management;Gait training;Stair training;Functional mobility training;Therapeutic activities;Therapeutic exercise;Balance training;Neuromuscular re-education;Patient/family education    PT Next Visit Plan 6MWT, obtain feedback regarding HEP, trial headturns on foam, step-ups, eyes closed on foam    PT Home Exercise Plan Heel raises, elevated STS, semitandem balance    Consulted and Agree with Plan of Care Patient           Patient will benefit from skilled therapeutic intervention in order to improve the following deficits and impairments:  Abnormal gait, Decreased activity tolerance, Decreased balance, Decreased mobility, Decreased range of motion, Difficulty walking, Increased muscle spasms, Decreased safety awareness, Decreased endurance, Decreased strength  Visit  Diagnosis: Unsteadiness on feet  Repeated falls     Problem List Patient Active Problem List   Diagnosis Date Noted   Mixed hyperlipidemia 11/06/2014   Osteoporosis 11/06/2014   Allergic rhinitis 09/23/2013   Cerebrovascular disease 09/23/2013   Durwin Reges DPT Durwin Reges 01/26/2020, 1:52 PM  [facility]  [address], [place], [address] Phone: [phone]   Fax:  [phone]  Name: JANYAH SINGLETERRY MRN: [id number] Date of Birth: 1932/12/13

## 2020-01-27 DIAGNOSIS — H40003 Preglaucoma, unspecified, bilateral: Secondary | ICD-10-CM | POA: Diagnosis not present

## 2020-01-28 ENCOUNTER — Other Ambulatory Visit: Payer: Self-pay

## 2020-01-28 ENCOUNTER — Ambulatory Visit: Payer: PPO | Admitting: Physical Therapy

## 2020-01-28 ENCOUNTER — Encounter: Payer: Self-pay | Admitting: Physical Therapy

## 2020-01-28 DIAGNOSIS — R296 Repeated falls: Secondary | ICD-10-CM

## 2020-01-28 DIAGNOSIS — R2681 Unsteadiness on feet: Secondary | ICD-10-CM | POA: Diagnosis not present

## 2020-01-28 NOTE — Therapy (Signed)
[facility] [address] [place], [address] Phone: [phone]   Fax:  [phone]  Physical Therapy Treatment  Patient Details  Name: Laura Ramos MRN: [id number] Date of Birth: 1932/11/11 Referring Provider (PT): Cameron Proud, [place]    Encounter Date: 01/28/2020   PT End of Session - 01/28/20 1448    Visit Number 3    Number of Visits 17    Date for PT Re-Evaluation 03/17/20    Authorization Type [employer]    Authorization Time Period 01/21/20-03/17/20    PT Start Time 0232    PT Stop Time 0310    PT Time Calculation (min) 38 min    Equipment Utilized During Treatment Gait belt    Activity Tolerance Patient tolerated treatment well;No increased pain    Behavior During Therapy WFL for tasks assessed/performed           Past Medical History:  Diagnosis Date  . Allergy   . Artery disease, cerebral   . Arthritis   . Cancer ([place])    skin  . Hyperlipidemia   . Osteoporosis   . TIA (transient ischemic attack)    x2, last apprx 2007. no deficits  . Vertigo    none in several yrs  . Wears dentures    partial lower    Past Surgical History:  Procedure Laterality Date  . CATARACT EXTRACTION W/ INTRAOCULAR LENS  IMPLANT, BILATERAL    . COLONOSCOPY    . PTOSIS REPAIR Bilateral 04/10/2017   Procedure: BLEPHAROPTOSIS REPAIR RESECT EX;  Surgeon: Karle Starch, MD;  Location: [facility];  Service: Ophthalmology;  Laterality: Bilateral;  . TUBAL LIGATION      There were no vitals filed for this visit.   Subjective Assessment - 01/28/20 1437    Subjective Patient reports she has been tired today d/t weater. Reports no pain or falls since last visit. Compliance with HEP.    How long can you sit comfortably? N/A    How long can you stand comfortably? N/A    How long can you walk comfortably? 25-30 minutes of shopping with heavy fatigue           Ther-Ex Nustep L3 seat  setting 8 no UEs 51mins Mini squat to elevated nustep 3x 10 with min cuing for technique, to utilize hip ext for stand with good carry over with BUE flexion  Bilat heel raises from step 3x 10  with cuing for eccentric control with good carry over Sidestepping YTB 3x 19ft R 27ft L with cuing for eccentric control and large step with decent carry over Standing on foam alt head turns x10; alternating ball retrieval and pass x12; with narrow BOS x12 CGA for safety  Standing on foam narrow BOS drawing alphabet reaching outside BOS with min encouragement for next letter with good carry over                          PT Education - 01/28/20 1447    Education Details therex form/technique    Person(s) Educated Patient    Methods Explanation;Demonstration;Verbal cues    Comprehension Returned demonstration;Verbalized understanding;Tactile cues required            PT Short Term Goals - 01/21/20 1546      PT SHORT TERM GOAL #1   Title After 8 weeks pt to demonstrate DGI score improvement to >21/24.    Baseline At  eval: 19/24    Time 4    Period Weeks    Status New    Target Date 02/18/20      PT SHORT TERM GOAL #2   Title After 4 weeks patient to demonstrate 5xSTS from standard chair <18sec hands free.    Baseline >25sec at evaluation    Time 4    Period Weeks    Status New    Target Date 02/18/20             PT Long Term Goals - 01/26/20 1342      PT LONG TERM GOAL #1   Title After 8 weeks pt to demonstrate improved BBT score >52/56 to reduce risk of falls.    Baseline 49/56    Time 8    Period Weeks    Status New      PT LONG TERM GOAL #2   Title After 8 weeks pt to demonstrate SLS balance >15 seconds bilat    Baseline <3sec bilat    Time 8    Period Weeks    Status New      PT LONG TERM GOAL #3   Title At 8 weeks pt able to 5xSTS: <13sec hands free.    Time 8    Period Weeks    Status New      PT LONG TERM GOAL #4   Title Patient will  increase FOTO score to 68 to demonstrate predicted increase in functional mobility to complete ADLs    Baseline 01/26/20 55    Time 8    Period Weeks    Status New                 Plan - 01/28/20 1451    Clinical Impression Statement PT continued therex progression for increased BLE strength and dynamic balance with good success. Patient is able to comply with mutlimodal cuing for proper technique of all therex with good motivation throughout session and no increased pain throughout sessions.    Personal Factors and Comorbidities Age;Fitness;Transportation    Examination-Activity Limitations Bathing;Locomotion Level;Transfers;Squat;Stairs;Toileting    Examination-Participation Restrictions Community Activity;Cleaning;Driving;Laundry    Stability/Clinical Decision Making Stable/Uncomplicated    Clinical Decision Making Moderate    Rehab Potential Excellent    PT Frequency 2x / week    PT Duration 8 weeks    PT Treatment/Interventions ADLs/Self Care Home Management;Gait training;Stair training;Functional mobility training;Therapeutic activities;Therapeutic exercise;Balance training;Neuromuscular re-education;Patient/family education    PT Next Visit Plan 6MWT, trial headturns on foam, step-ups, eyes closed on foam    PT Home Exercise Plan Heel raises, elevated STS, semitandem balance    Consulted and Agree with Plan of Care Patient           Patient will benefit from skilled therapeutic intervention in order to improve the following deficits and impairments:  Abnormal gait, Decreased activity tolerance, Decreased balance, Decreased mobility, Decreased range of motion, Difficulty walking, Increased muscle spasms, Decreased safety awareness, Decreased endurance, Decreased strength  Visit Diagnosis: Unsteadiness on feet  Repeated falls     Problem List Patient Active Problem List   Diagnosis Date Noted  . Mixed hyperlipidemia 11/06/2014  . Osteoporosis 11/06/2014  .  Allergic rhinitis 09/23/2013  . Cerebrovascular disease 09/23/2013   Durwin Reges DPT Durwin Reges 01/28/2020, 3:12 PM  [facility] [address] [place], [address] Phone: [phone]   Fax:  [phone]  Name: ELLIYAH LISZEWSKI MRN: [id number] Date of  Birth: [birth year]

## 2020-02-03 ENCOUNTER — Encounter: Payer: Self-pay | Admitting: Physical Therapy

## 2020-02-03 ENCOUNTER — Ambulatory Visit: Payer: PPO | Admitting: Physical Therapy

## 2020-02-03 ENCOUNTER — Other Ambulatory Visit: Payer: Self-pay

## 2020-02-03 DIAGNOSIS — R2681 Unsteadiness on feet: Secondary | ICD-10-CM | POA: Diagnosis not present

## 2020-02-03 DIAGNOSIS — R296 Repeated falls: Secondary | ICD-10-CM

## 2020-02-03 NOTE — Therapy (Signed)
[facility] [address], [place], [address] Phone: [phone]   Fax:  [phone]  Physical Therapy Treatment  Patient Details  Name: Laura Ramos MRN: [id number] Date of Birth: April 02, 1933 Referring Provider (PT): Cameron Proud, [place]    Encounter Date: 02/03/2020   PT End of Session - 02/03/20 1124    Visit Number 4    Number of Visits 17    Date for PT Re-Evaluation 03/17/20    Authorization Type [employer]    Authorization Time Period 01/21/20-03/17/20    PT Start Time 1120    PT Stop Time 1200    PT Time Calculation (min) 40 min    Equipment Utilized During Treatment Gait belt    Activity Tolerance Patient tolerated treatment well;No increased pain    Behavior During Therapy WFL for tasks assessed/performed           Past Medical History:  Diagnosis Date  . Allergy   . Artery disease, cerebral   . Arthritis   . Cancer ([place])    skin  . Hyperlipidemia   . Osteoporosis   . TIA (transient ischemic attack)    x2, last apprx 2007. no deficits  . Vertigo    none in several yrs  . Wears dentures    partial lower    Past Surgical History:  Procedure Laterality Date  . CATARACT EXTRACTION W/ INTRAOCULAR LENS  IMPLANT, BILATERAL    . COLONOSCOPY    . PTOSIS REPAIR Bilateral 04/10/2017   Procedure: BLEPHAROPTOSIS REPAIR RESECT EX;  Surgeon: Karle Starch, MD;  Location: [facility];  Service: Ophthalmology;  Laterality: Bilateral;  . TUBAL LIGATION      There were no vitals filed for this visit.   Subjective Assessment - 02/03/20 1125    Subjective Patient reports no pain or near falls since last visit. Has been completing HEP.    How long can you sit comfortably? N/A    How long can you stand comfortably? N/A    How long can you walk comfortably? 25-30 minutes of shopping with heavy fatigue          Ther-Ex Nustep L3 seat setting 8 no UEs 45mins Mini squat to  elevated nustep 2x10; 5# DB swing LUE x10 with cuing to prevent excessive "knees over toes" d/t knee pain Sidestepping RTB  19ft 2x L and R with CGA for safety, cuing for eccentric control with decent carry over Dynamic balance with 3x 6in hurdle negotiation with 2 cone 90d turn negotiation x2 rounds  Dynamic balance  Tandem walking 34ft FWD and BWD x2 rounds CGA for safety  Tandem stance R 30sec L 30sec 1 instance of UE support SLS 30sec 2 finger support  Standing on foam semi tandem BOS drawing alphabet reaching outside BOS with CGA                            PT Education - 02/03/20 1124    Education Details therex form/technique    Person(s) Educated Patient    Methods Explanation;Demonstration;Verbal cues    Comprehension Verbalized understanding;Returned demonstration;Verbal cues required            PT Short Term Goals - 01/21/20 1546      PT SHORT TERM GOAL #1   Title After 8 weeks pt to demonstrate DGI score improvement to >21/24.    Baseline At eval: 19/24  Time 4    Period Weeks    Status New    Target Date 02/18/20      PT SHORT TERM GOAL #2   Title After 4 weeks patient to demonstrate 5xSTS from standard chair <18sec hands free.    Baseline >25sec at evaluation    Time 4    Period Weeks    Status New    Target Date 02/18/20             PT Long Term Goals - 01/26/20 1342      PT LONG TERM GOAL #1   Title After 8 weeks pt to demonstrate improved BBT score >52/56 to reduce risk of falls.    Baseline 49/56    Time 8    Period Weeks    Status New      PT LONG TERM GOAL #2   Title After 8 weeks pt to demonstrate SLS balance >15 seconds bilat    Baseline <3sec bilat    Time 8    Period Weeks    Status New      PT LONG TERM GOAL #3   Title At 8 weeks pt able to 5xSTS: <13sec hands free.    Time 8    Period Weeks    Status New      PT LONG TERM GOAL #4   Title Patient will increase FOTO score to 68 to demonstrate predicted  increase in functional mobility to complete ADLs    Baseline 01/26/20 55    Time 8    Period Weeks    Status New                 Plan - 02/03/20 1155    Clinical Impression Statement PT continued therex progression for increased BLE strength and dynamic balance with success. Patient is progressing toward increased SLS BOS well, and is motivated throughout session, with no increased pain. Modifications made for L wrist as needed. PT will continue progression as able.    Personal Factors and Comorbidities Age;Fitness;Transportation    Examination-Activity Limitations Bathing;Locomotion Level;Transfers;Squat;Stairs;Toileting    Examination-Participation Restrictions Community Activity;Cleaning;Driving;Laundry    Stability/Clinical Decision Making Stable/Uncomplicated    Clinical Decision Making Moderate    Rehab Potential Excellent    PT Frequency 2x / week    PT Duration 8 weeks    PT Treatment/Interventions ADLs/Self Care Home Management;Gait training;Stair training;Functional mobility training;Therapeutic activities;Therapeutic exercise;Balance training;Neuromuscular re-education;Patient/family education    PT Next Visit Plan 6MWT, trial headturns on foam, step-ups, eyes closed on foam    PT Home Exercise Plan Heel raises, elevated STS, semitandem balance    Consulted and Agree with Plan of Care Patient           Patient will benefit from skilled therapeutic intervention in order to improve the following deficits and impairments:  Abnormal gait, Decreased activity tolerance, Decreased balance, Decreased mobility, Decreased range of motion, Difficulty walking, Increased muscle spasms, Decreased safety awareness, Decreased endurance, Decreased strength  Visit Diagnosis: Unsteadiness on feet  Repeated falls     Problem List Patient Active Problem List   Diagnosis Date Noted  . Mixed hyperlipidemia 11/06/2014  . Osteoporosis 11/06/2014  . Allergic rhinitis 09/23/2013  .  Cerebrovascular disease 09/23/2013   Durwin Reges DPT Durwin Reges 02/03/2020, 12:54 PM  [facility] [address] [place], [address] Phone: [phone]   Fax:  [phone]  Name: Laura Ramos MRN: [id number] Date of Birth:  08/16/1932   

## 2020-02-05 ENCOUNTER — Encounter: Payer: PPO | Admitting: Physical Therapy

## 2020-02-05 DIAGNOSIS — S52571D Other intraarticular fracture of lower end of right radius, subsequent encounter for closed fracture with routine healing: Secondary | ICD-10-CM | POA: Diagnosis not present

## 2020-02-12 ENCOUNTER — Other Ambulatory Visit: Payer: Self-pay

## 2020-02-12 ENCOUNTER — Ambulatory Visit: Payer: PPO | Admitting: Occupational Therapy

## 2020-02-12 ENCOUNTER — Encounter: Payer: Self-pay | Admitting: Occupational Therapy

## 2020-02-12 DIAGNOSIS — M25531 Pain in right wrist: Secondary | ICD-10-CM

## 2020-02-12 DIAGNOSIS — M79641 Pain in right hand: Secondary | ICD-10-CM

## 2020-02-12 DIAGNOSIS — M6281 Muscle weakness (generalized): Secondary | ICD-10-CM

## 2020-02-12 DIAGNOSIS — M25641 Stiffness of right hand, not elsewhere classified: Secondary | ICD-10-CM

## 2020-02-12 DIAGNOSIS — M25631 Stiffness of right wrist, not elsewhere classified: Secondary | ICD-10-CM

## 2020-02-12 DIAGNOSIS — R2681 Unsteadiness on feet: Secondary | ICD-10-CM | POA: Diagnosis not present

## 2020-02-12 NOTE — Therapy (Signed)
[facility] [address], [place], [address] Phone: [phone]   Fax:  [phone]  Occupational Therapy Evaluation  Patient Details  Name: Laura Ramos MRN: [id number] Date of Birth: 03/19/1933 Referring Provider (OT): Mikle Bosworth   Encounter Date: 02/12/2020   OT End of Session - 02/12/20 1605    Visit Number 1    Number of Visits 10    Date for OT Re-Evaluation 03/18/20    OT Start Time 0902    OT Stop Time 0958    OT Time Calculation (min) 56 min    Activity Tolerance Patient tolerated treatment well    Behavior During Therapy [facility] for tasks assessed/performed           Past Medical History:  Diagnosis Date  . Allergy   . Artery disease, cerebral   . Arthritis   . Cancer ([place])    skin  . Hyperlipidemia   . Osteoporosis   . TIA (transient ischemic attack)    x2, last apprx 2007. no deficits  . Vertigo    none in several yrs  . Wears dentures    partial lower    Past Surgical History:  Procedure Laterality Date  . CATARACT EXTRACTION W/ INTRAOCULAR LENS  IMPLANT, BILATERAL    . COLONOSCOPY    . PTOSIS REPAIR Bilateral 04/10/2017   Procedure: BLEPHAROPTOSIS REPAIR RESECT EX;  Surgeon: Karle Starch, MD;  Location: [facility];  Service: Ophthalmology;  Laterality: Bilateral;  . TUBAL LIGATION      There were no vitals filed for this visit.   Subjective Assessment - 02/12/20 1554    Subjective  I am R hand dominant -and this has been really hard to do things - I cannot drive - cannot do my gears, cannot go to the pool for water aerobics, my daughter has been a big help    Pertinent History The patient suffered a fall on 12/19/2019, the patient was treated with a short arm cast. The patient is now close to 1 month status post initial injury, she has been in the short arm cast since her initial visit on 12/23/2019. The patient feels that she is doing well. She denies any significant  pain to the right wrist. She denies any numbness or tingling to the right upper extremity at today's visit. Denies any repeat trauma since she was last evaluated. Cast was removed on 01/16/2020 and put in wrist splint - refer to OT for hand therapy on 02/05/2020    Patient Stated Goals I want to be able to use my R hand to drive , paint again , open jars , packages , pick up and hold objects    Currently in Pain? Yes    Pain Score 3     Pain Location --   R thumb   Pain Orientation Right    Pain Descriptors / Indicators Aching    Pain Type Chronic pain    Pain Onset More than a month ago    Pain Frequency Intermittent             [facility] OT Assessment - 02/12/20 0001      Assessment   Medical Diagnosis R distal radius fx     Referring Provider (OT) Mikle Bosworth    Onset Date/Surgical Date 12/19/19    Hand Dominance Right      Precautions   Precautions Fall    Required Braces or Orthoses --   weight  and weight bearing to increase with caution     Balance Screen   Has the patient fallen in the past 6 months Yes    How many times? 2    Has the patient had a decrease in activity level because of a fear of falling?  Yes    Is the patient reluctant to leave their home because of a fear of falling?  No      Home  Environment   Lives With Alone      Prior Function   Vocation Retired    Leisure some plants in yard,  artist(paint), puzzles, water aerobics       AROM   Right Forearm Pronation 90 Degrees    Right Forearm Supination 90 Degrees    Left Forearm Pronation 90 Degrees    Left Forearm Supination 90 Degrees    Right Wrist Extension 50 Degrees    Right Wrist Flexion 35 Degrees    Right Wrist Radial Deviation 12 Degrees    Right Wrist Ulnar Deviation 15 Degrees    Left Wrist Extension 70 Degrees    Left Wrist Flexion 80 Degrees    Left Wrist Radial Deviation 20 Degrees    Left Wrist Ulnar Deviation 30 Degrees      Right Hand AROM   R Thumb Opposition to Index --   severe  thumb CMC arthritis /adducted   R Index  MCP 0-90 60 Degrees    R Index PIP 0-100 100 Degrees    R Long  MCP 0-90 70 Degrees    R Long PIP 0-100 100 Degrees    R Ring  MCP 0-90 70 Degrees    R Ring PIP 0-100 100 Degrees    R Little  MCP 0-90 80 Degrees    R Little PIP 0-100 90 Degrees                    OT Treatments/Exercises (OP) - 02/12/20 0001      RUE Fluidotherapy   Number Minutes Fluidotherapy 10 Minutes    RUE Fluidotherapy Location Hand;Wrist    Comments AROM for wrist and digits- ice 2 cycles          Review HEP and hand out provided   Contrast  2-3 x day AAROM for wrist over edge of table for wrist flexion, ext, RD,UD 10 reps  AROM for wrist in all planes 10 reps Tendon glides - 10 reps Opposition to all digits- 10 reps Pain free- or slight pull or stretch  less than 2/10   Fitted with CMC  Neoprene splint to use for thumb pain and to try change gears in car        OT Education - 02/12/20 1605    Education Details findings of eval and HEP    Person(s) Educated Patient    Methods Explanation;Demonstration;Tactile cues;Verbal cues;Handout    Comprehension Verbal cues required;Returned demonstration;Verbalized understanding            OT Short Term Goals - 02/12/20 1610      OT SHORT TERM GOAL #1   Title Pt to be independent in HEP to increase R wrist AROM and fisting to touch palm without increase symptoms    Baseline no knowledge of HEP    Time 3    Period Weeks    Status New    Target Date 03/04/20      OT SHORT TERM GOAL #2   Title R hand flexion increase for pt  to touch palm to hold utencils, brush, toiletpaper    Baseline cannot make tight fist - increase edema and pain in 5th digit and over MC's - decrease AROM in all MC's and 5th see flowsheet    Time 3    Period Weeks    Status New    Target Date 03/04/20             OT Long Term Goals - 02/12/20 1613      OT LONG TERM GOAL #1   Title R wrist AROM increase to [facility]  for pt to use dominant hand in more than 75% of ADL's and IADLs    Baseline Just starting using hand and wrist last week -only using hand 25% per funcion on PRWHE    Time 5    Period Weeks    Status New    Target Date 03/18/20      OT LONG TERM GOAL #2   Title R grip and prehension strength increase to more than 50% compare to L hand to change gear of car, turn doorknob , carry more than 4 lbs, without increase symptoms    Baseline NT - 8 wks out of injuury - but first visit - out of splint last week    Time 5    Period Weeks    Status New    Target Date 03/18/20                 Plan - 02/12/20 1606    Clinical Impression Statement Pt will be tomorrow 8 wks out from R distal radius fx- pt was in cast and then wrist until 02/05/2020 - pt is R hand dominant - and present with decrease AROM for wrist in all planes, increase pain with wrist flexion tand thumb CMC ther worse - pt show decrease increase edema over MC's and 5th digit, increase pain and decrease ROM and strength - all limiting her functional use of R dominant hand in ADL;s and IADL's - pt can benefit from OT services    OT Occupational Profile and History Problem Focused Assessment - Including review of records relating to presenting problem    Occupational performance deficits (Please refer to evaluation for details): ADL's;IADL's;Play;Leisure;Social Participation    Body Structure / Function / Physical Skills ADL;Edema;Flexibility;IADL;Pain;UE functional use;ROM;Strength    Rehab Potential Good    Clinical Decision Making Limited treatment options, no task modification necessary    Comorbidities Affecting Occupational Performance: May have comorbidities impacting occupational performance   limited by R thumb CMC arthritis   Modification or Assistance to Complete Evaluation  No modification of tasks or assist necessary to complete eval    OT Frequency 2x / week    OT Duration --   5 wks   OT Treatment/Interventions  Self-care/ADL training;Contrast Bath;Fluidtherapy;Therapeutic exercise;Manual Therapy;Passive range of motion;Therapeutic activities;Splinting;Patient/family education    Plan assess progress with HEP - modify as needed    Consulted and Agree with Plan of Care Patient           Patient will benefit from skilled therapeutic intervention in order to improve the following deficits and impairments:   Body Structure / Function / Physical Skills: ADL, Edema, Flexibility, IADL, Pain, UE functional use, ROM, Strength       Visit Diagnosis: Stiffness of right wrist, not elsewhere classified - Plan: Ot plan of care cert/re-cert  Stiffness of right hand, not elsewhere classified - Plan: Ot plan of care cert/re-cert  Muscle weakness (generalized) - Plan:  Ot plan of care cert/re-cert  Pain in right wrist - Plan: Ot plan of care cert/re-cert  Pain in right hand - Plan: Ot plan of care cert/re-cert    Problem List Patient Active Problem List   Diagnosis Date Noted  . Mixed hyperlipidemia 11/06/2014  . Osteoporosis 11/06/2014  . Allergic rhinitis 09/23/2013  . Cerebrovascular disease 09/23/2013    Rosalyn Gess OTR/L,CLT 02/12/2020, 4:22 PM  [facility] [address], [place], [address] Phone: [phone]   Fax:  [phone]  Name: JAMERIA BRADWAY MRN: [id number] Date of Birth: Oct 24, 1932

## 2020-02-12 NOTE — Patient Instructions (Signed)
Contrast  2-3 x day AAROM for wrist over edge of table for wrist flexion, ext, RD,UD 10 reps  AROM for wrist in all planes 10 reps Tendon glides - 10 reps Opposition to all digits- 10 reps Pain free- or slight pull or stretch  less than 2/10   Fitted with CMC  Neoprene splint to use for thumb pain and to try change gears in car

## 2020-02-16 ENCOUNTER — Ambulatory Visit: Payer: PPO | Admitting: Physical Therapy

## 2020-02-17 ENCOUNTER — Other Ambulatory Visit: Payer: Self-pay

## 2020-02-17 ENCOUNTER — Ambulatory Visit: Payer: PPO | Admitting: Occupational Therapy

## 2020-02-17 ENCOUNTER — Encounter: Payer: PPO | Admitting: Occupational Therapy

## 2020-02-17 DIAGNOSIS — M25531 Pain in right wrist: Secondary | ICD-10-CM

## 2020-02-17 DIAGNOSIS — M25631 Stiffness of right wrist, not elsewhere classified: Secondary | ICD-10-CM

## 2020-02-17 DIAGNOSIS — M79641 Pain in right hand: Secondary | ICD-10-CM

## 2020-02-17 DIAGNOSIS — R2681 Unsteadiness on feet: Secondary | ICD-10-CM | POA: Diagnosis not present

## 2020-02-17 DIAGNOSIS — M25641 Stiffness of right hand, not elsewhere classified: Secondary | ICD-10-CM

## 2020-02-17 DIAGNOSIS — M6281 Muscle weakness (generalized): Secondary | ICD-10-CM

## 2020-02-17 NOTE — Therapy (Signed)
[facility] [address], [place], [address] Phone: [phone]   Fax:  [phone]  Occupational Therapy Treatment  Patient Details  Name: Laura Ramos MRN: [id number] Date of Birth: 1932-08-04 Referring Provider (OT): Mikle Bosworth   Encounter Date: 02/17/2020   OT End of Session - 02/17/20 1651    Visit Number 2    Number of Visits 10    Date for OT Re-Evaluation 03/18/20    OT Start Time 1644    OT Stop Time 1720    OT Time Calculation (min) 36 min    Activity Tolerance Patient tolerated treatment well    Behavior During Therapy [facility] for tasks assessed/performed           Past Medical History:  Diagnosis Date  . Allergy   . Artery disease, cerebral   . Arthritis   . Cancer ([place])    skin  . Hyperlipidemia   . Osteoporosis   . TIA (transient ischemic attack)    x2, last apprx 2007. no deficits  . Vertigo    none in several yrs  . Wears dentures    partial lower    Past Surgical History:  Procedure Laterality Date  . CATARACT EXTRACTION W/ INTRAOCULAR LENS  IMPLANT, BILATERAL    . COLONOSCOPY    . PTOSIS REPAIR Bilateral 04/10/2017   Procedure: BLEPHAROPTOSIS REPAIR RESECT EX;  Surgeon: Karle Starch, MD;  Location: [facility];  Service: Ophthalmology;  Laterality: Bilateral;  . TUBAL LIGATION      There were no vitals filed for this visit.   Subjective Assessment - 02/17/20 1649    Subjective  I was able to start my car and change my gears - it felt so good- that soft thumb splint helped so much -and went to the pool this am -and felt so good - and did not had trouble getting out of pool    Pertinent History The patient suffered a fall on 12/19/2019, the patient was treated with a short arm cast. The patient is now close to 1 month status post initial injury, she has been in the short arm cast since her initial visit on 12/23/2019. The patient feels that she is doing well. She  denies any significant pain to the right wrist. She denies any numbness or tingling to the right upper extremity at today's visit. Denies any repeat trauma since she was last evaluated. Cast was removed on 01/16/2020 and put in wrist splint - refer to OT for hand therapy on 02/05/2020    Patient Stated Goals I want to be able to use my R hand to drive , paint again , open jars , packages , pick up and hold objects    Currently in Pain? No/denies              [facility] OT Assessment - 02/17/20 0001      AROM   Right Wrist Extension 55 Degrees    Right Wrist Flexion 43 Degrees    Right Wrist Radial Deviation 18 Degrees    Right Wrist Ulnar Deviation 20 Degrees            Pt show increase AROM in R wrist  and report able to start her car and change the gears - with using the thumb CMC neoprene splint  And was able to return to water aerobics this am -and did not use weights and kept hands close while doing UE exercises  OT Treatments/Exercises (OP) - 02/17/20 0001      RUE Fluidotherapy   Number Minutes Fluidotherapy 10 Minutes    RUE Fluidotherapy Location Hand;Wrist    Comments AROM for wrist in all planes and digits            soft tissue using graston tool nr 2 for light sweeping on volar wrist and hand - on 5th digit - prior to AROM and AAROM   Review with pt AAROM for wrist over edge of table for wrist flexion, ext, RD,UD- need min A - did not do RD, UD 10 reps  AROM for wrist in all planes 10 reps Tendon glides - 10 reps Opposition to all digits- 10 reps Pain free- or slight pull or stretch  less than 2/10  Add and review 1 lbs for wrist in all planes - 10 reps - no pain Can increase in 3 days if pain free- to 2 sets 2 x day   Fitted with [facility]  Neoprene splint to use for thumb pain last time        OT Education - 02/17/20 1650    Education Details progress in AROM for wrist and use of hand , change to HEP    Person(s) Educated Patient    Methods  Explanation;Demonstration;Tactile cues;Verbal cues;Handout    Comprehension Verbal cues required;Returned demonstration;Verbalized understanding            OT Short Term Goals - 02/12/20 1610      OT SHORT TERM GOAL #1   Title Pt to be independent in HEP to increase R wrist AROM and fisting to touch palm without increase symptoms    Baseline no knowledge of HEP    Time 3    Period Weeks    Status New    Target Date 03/04/20      OT SHORT TERM GOAL #2   Title R hand flexion increase for pt to touch palm to hold utencils, brush, toiletpaper    Baseline cannot make tight fist - increase edema and pain in 5th digit and over MC's - decrease AROM in all MC's and 5th see flowsheet    Time 3    Period Weeks    Status New    Target Date 03/04/20             OT Long Term Goals - 02/12/20 1613      OT LONG TERM GOAL #1   Title R wrist AROM increase to [facility] for pt to use dominant hand in more than 75% of ADL's and IADLs    Baseline Just starting using hand and wrist last week -only using hand 25% per funcion on PRWHE    Time 5    Period Weeks    Status New    Target Date 03/18/20      OT LONG TERM GOAL #2   Title R grip and prehension strength increase to more than 50% compare to L hand to change gear of car, turn doorknob , carry more than 4 lbs, without increase symptoms    Baseline NT - 8 wks out of injuury - but first visit - out of splint last week    Time 5    Period Weeks    Status New    Target Date 03/18/20                 Plan - 02/17/20 1651    Clinical Impression Statement Pt is 8 1/2 wks out from  R distal radius fx - pt show increase AROM in wrist in all planes -and decrease edema in 5th digit and less pain -able to initiate 1 lbs for wrist in all planes - pt to keep HEP pain free    OT Occupational Profile and History Problem Focused Assessment - Including review of records relating to presenting problem    Occupational performance deficits (Please refer  to evaluation for details): ADL's;IADL's;Play;Leisure;Social Participation    Body Structure / Function / Physical Skills ADL;Edema;Flexibility;IADL;Pain;UE functional use;ROM;Strength    Rehab Potential Good    Clinical Decision Making Limited treatment options, no task modification necessary    Comorbidities Affecting Occupational Performance: May have comorbidities impacting occupational performance    Modification or Assistance to Complete Evaluation  No modification of tasks or assist necessary to complete eval    OT Frequency 2x / week    OT Duration 4 weeks    OT Treatment/Interventions Self-care/ADL training;Contrast Bath;Fluidtherapy;Therapeutic exercise;Manual Therapy;Passive range of motion;Therapeutic activities;Splinting;Patient/family education    Plan assess progress with HEP - modify as needed    Consulted and Agree with Plan of Care Patient           Patient will benefit from skilled therapeutic intervention in order to improve the following deficits and impairments:   Body Structure / Function / Physical Skills: ADL, Edema, Flexibility, IADL, Pain, UE functional use, ROM, Strength       Visit Diagnosis: Stiffness of right wrist, not elsewhere classified  Stiffness of right hand, not elsewhere classified  Muscle weakness (generalized)  Pain in right wrist  Pain in right hand    Problem List Patient Active Problem List   Diagnosis Date Noted  . Mixed hyperlipidemia 11/06/2014  . Osteoporosis 11/06/2014  . Allergic rhinitis 09/23/2013  . Cerebrovascular disease 09/23/2013    Rosalyn Gess OTR/L,CLT 02/17/2020, 5:25 PM  [facility] [address] [place], [address] Phone: [phone]   Fax:  [phone]  Name: TASHEA OTHMAN MRN: [id number] Date of Birth: [birth year]

## 2020-02-18 ENCOUNTER — Encounter: Payer: PPO | Admitting: Physical Therapy

## 2020-02-23 ENCOUNTER — Encounter: Payer: PPO | Admitting: Physical Therapy

## 2020-02-24 ENCOUNTER — Ambulatory Visit: Payer: PPO | Attending: Student | Admitting: Occupational Therapy

## 2020-02-24 ENCOUNTER — Other Ambulatory Visit: Payer: Self-pay

## 2020-02-24 DIAGNOSIS — M25641 Stiffness of right hand, not elsewhere classified: Secondary | ICD-10-CM | POA: Insufficient documentation

## 2020-02-24 DIAGNOSIS — M25631 Stiffness of right wrist, not elsewhere classified: Secondary | ICD-10-CM

## 2020-02-24 DIAGNOSIS — M6281 Muscle weakness (generalized): Secondary | ICD-10-CM | POA: Insufficient documentation

## 2020-02-24 DIAGNOSIS — M25531 Pain in right wrist: Secondary | ICD-10-CM | POA: Insufficient documentation

## 2020-02-24 DIAGNOSIS — M79641 Pain in right hand: Secondary | ICD-10-CM | POA: Diagnosis not present

## 2020-02-24 NOTE — Therapy (Signed)
[facility] [address] [place], [address] Phone: [phone]   Fax:  [phone]  Occupational Therapy Treatment  Patient Details  Name: Laura Ramos MRN: [id number] Date of Birth: 04-09-1933 Referring Provider (OT): Mikle Bosworth   Encounter Date: 02/24/2020   OT End of Session - 02/24/20 1321    Visit Number 3    Number of Visits 10    Date for OT Re-Evaluation 03/18/20    OT Start Time 1300    OT Stop Time 1339    OT Time Calculation (min) 39 min    Activity Tolerance Patient tolerated treatment well    Behavior During Therapy [facility] for tasks assessed/performed           Past Medical History:  Diagnosis Date  . Allergy   . Artery disease, cerebral   . Arthritis   . Cancer ([place])    skin  . Hyperlipidemia   . Osteoporosis   . TIA (transient ischemic attack)    x2, last apprx 2007. no deficits  . Vertigo    none in several yrs  . Wears dentures    partial lower    Past Surgical History:  Procedure Laterality Date  . CATARACT EXTRACTION W/ INTRAOCULAR LENS  IMPLANT, BILATERAL    . COLONOSCOPY    . PTOSIS REPAIR Bilateral 04/10/2017   Procedure: BLEPHAROPTOSIS REPAIR RESECT EX;  Surgeon: Karle Starch, MD;  Location: [facility];  Service: Ophthalmology;  Laterality: Bilateral;  . TUBAL LIGATION      There were no vitals filed for this visit.   Subjective Assessment - 02/24/20 1316    Subjective  I do have 1 and 2 lbs at home - using it more- I did drive and drove today to you    Pertinent History The patient suffered a fall on 12/19/2019, the patient was treated with a short arm cast. The patient is now close to 1 month status post initial injury, she has been in the short arm cast since her initial visit on 12/23/2019. The patient feels that she is doing well. She denies any significant pain to the right wrist. She denies any numbness or tingling to the right upper extremity at  today's visit. Denies any repeat trauma since she was last evaluated. Cast was removed on 01/16/2020 and put in wrist splint - refer to OT for hand therapy on 02/05/2020    Patient Stated Goals I want to be able to use my R hand to drive , paint again , open jars , packages , pick up and hold objects    Currently in Pain? No/denies              [facility] OT Assessment - 02/24/20 0001      AROM   Right Wrist Extension 65 Degrees    Right Wrist Flexion 45 Degrees    Right Wrist Radial Deviation 20 Degrees    Right Wrist Ulnar Deviation 30 Degrees      Strength   Right Hand Grip (lbs) 16    Right Hand Lateral Pinch 5 lbs    Right Hand 3 Point Pinch 5 lbs    Left Hand Grip (lbs) 25    Left Hand Lateral Pinch 9 lbs    Left Hand 3 Point Pinch 9 lbs          making great progress in AROM - except flexion - pain on dorsal side Pt report CMC arthritis Grip  increase - prehension strength same          OT Treatments/Exercises (OP) - 02/24/20 0001      RUE Paraffin   Number Minutes Paraffin 8 Minutes    RUE Paraffin Location Wrist    Comments wrist flexion stretch - 2 min flexion -stretch x2 alternate with extention            soft tissue using graston tool nr 2 for light sweeping on dorsal/volar wrist, forearm and hand  prior to AROM and AAROM   Review with pt AAROM for wrist over edge of table for wrist flexion, ext, RD,UD- need min A - did not do RD, UD And pt to do double time for flexion for every extention 10 reps  Done this date in paraffin - extended wrist flexion stretch- 2 min - pt to do at home using her heatingpad Tendon glides - 10 reps Opposition to all digits- 10 reps Pain free- or slight pull or stretch less than 2/10   review 1 lbs for wrist in all planes - 10 reps - no pain- need min A - for correct position  Can do 2 sets for all -and 3rd for sup/pro, RD, UD  Add putty light blue for gripping- 2 x 12 reps 2 x day painfree         OT Education  - 02/24/20 1320    Education Details progress in AROM for wrist and use of hand , change to HEP    Person(s) Educated Patient    Methods Explanation;Demonstration;Tactile cues;Verbal cues;Handout    Comprehension Verbal cues required;Returned demonstration;Verbalized understanding            OT Short Term Goals - 02/12/20 1610      OT SHORT TERM GOAL #1   Title Pt to be independent in HEP to increase R wrist AROM and fisting to touch palm without increase symptoms    Baseline no knowledge of HEP    Time 3    Period Weeks    Status New    Target Date 03/04/20      OT SHORT TERM GOAL #2   Title R hand flexion increase for pt to touch palm to hold utencils, brush, toiletpaper    Baseline cannot make tight fist - increase edema and pain in 5th digit and over MC's - decrease AROM in all MC's and 5th see flowsheet    Time 3    Period Weeks    Status New    Target Date 03/04/20             OT Long Term Goals - 02/12/20 1613      OT LONG TERM GOAL #1   Title R wrist AROM increase to [facility] for pt to use dominant hand in more than 75% of ADL's and IADLs    Baseline Just starting using hand and wrist last week -only using hand 25% per funcion on PRWHE    Time 5    Period Weeks    Status New    Target Date 03/18/20      OT LONG TERM GOAL #2   Title R grip and prehension strength increase to more than 50% compare to L hand to change gear of car, turn doorknob , carry more than 4 lbs, without increase symptoms    Baseline NT - 8 wks out of injuury - but first visit - out of splint last week    Time 5    Period Weeks  Status New    Target Date 03/18/20                 Plan - 02/24/20 1322    Clinical Impression Statement Pt is 9 1/1 wks out from R distal radius fx - pt show increase AROM - flexion was about the same - did focus on wrist flexion stretches and add prolonged flexion stretch to HEP -and 1 lbs weight for wrist in all planes - but can increase to 2-3 sets pain  free- add putty for gripping 2 x 12 reps    OT Occupational Profile and History Problem Focused Assessment - Including review of records relating to presenting problem    Occupational performance deficits (Please refer to evaluation for details): ADL's;IADL's;Play;Leisure;Social Participation    Body Structure / Function / Physical Skills ADL;Edema;Flexibility;IADL;Pain;UE functional use;ROM;Strength    Rehab Potential Good    Clinical Decision Making Limited treatment options, no task modification necessary    Comorbidities Affecting Occupational Performance: May have comorbidities impacting occupational performance    Modification or Assistance to Complete Evaluation  No modification of tasks or assist necessary to complete eval    OT Frequency 2x / week    OT Duration 4 weeks    OT Treatment/Interventions Self-care/ADL training;Contrast Bath;Fluidtherapy;Therapeutic exercise;Manual Therapy;Passive range of motion;Therapeutic activities;Splinting;Patient/family education    Plan assess progress with HEP - modify as needed    Consulted and Agree with Plan of Care Patient           Patient will benefit from skilled therapeutic intervention in order to improve the following deficits and impairments:   Body Structure / Function / Physical Skills: ADL, Edema, Flexibility, IADL, Pain, UE functional use, ROM, Strength       Visit Diagnosis: Stiffness of right wrist, not elsewhere classified  Stiffness of right hand, not elsewhere classified  Muscle weakness (generalized)  Pain in right wrist  Pain in right hand    Problem List Patient Active Problem List   Diagnosis Date Noted  . Mixed hyperlipidemia 11/06/2014  . Osteoporosis 11/06/2014  . Allergic rhinitis 09/23/2013  . Cerebrovascular disease 09/23/2013    Rosalyn Gess 02/24/2020, 2:27 PM  [facility] [address], [place], [address] Phone:  [phone]   Fax:  [phone]  Name: CARMEN TOLLIVER MRN: [id number] Date of Birth: [birth year]

## 2020-02-25 ENCOUNTER — Encounter: Payer: PPO | Admitting: Physical Therapy

## 2020-02-27 ENCOUNTER — Ambulatory Visit: Payer: PPO | Admitting: Occupational Therapy

## 2020-02-27 ENCOUNTER — Other Ambulatory Visit: Payer: Self-pay

## 2020-02-27 DIAGNOSIS — M6281 Muscle weakness (generalized): Secondary | ICD-10-CM

## 2020-02-27 DIAGNOSIS — M25531 Pain in right wrist: Secondary | ICD-10-CM

## 2020-02-27 DIAGNOSIS — M25631 Stiffness of right wrist, not elsewhere classified: Secondary | ICD-10-CM

## 2020-02-27 DIAGNOSIS — M25641 Stiffness of right hand, not elsewhere classified: Secondary | ICD-10-CM

## 2020-02-27 DIAGNOSIS — M79641 Pain in right hand: Secondary | ICD-10-CM

## 2020-02-27 NOTE — Therapy (Signed)
[name] [address], [place], [address] Phone: [phone]   Fax:  [phone]  Occupational Therapy Treatment  Patient Details  Name: Laura Ramos MRN: [id number] Date of Birth: 07/27/1932 Referring Provider (OT): Mikle Bosworth   Encounter Date: 02/27/2020   OT End of Session - 02/27/20 1328    Visit Number 4    Number of Visits 10    Date for OT Re-Evaluation 03/18/20    OT Start Time 1215    OT Stop Time 1305    OT Time Calculation (min) 50 min    Activity Tolerance Patient tolerated treatment well    Behavior During Therapy [facility] for tasks assessed/performed           Past Medical History:  Diagnosis Date  . Allergy   . Artery disease, cerebral   . Arthritis   . Cancer ([place])    skin  . Hyperlipidemia   . Osteoporosis   . TIA (transient ischemic attack)    x2, last apprx 2007. no deficits  . Vertigo    none in several yrs  . Wears dentures    partial lower    Past Surgical History:  Procedure Laterality Date  . CATARACT EXTRACTION W/ INTRAOCULAR LENS  IMPLANT, BILATERAL    . COLONOSCOPY    . PTOSIS REPAIR Bilateral 04/10/2017   Procedure: BLEPHAROPTOSIS REPAIR RESECT EX;  Surgeon: Karle Starch, MD;  Location: [facility];  Service: Ophthalmology;  Laterality: Bilateral;  . TUBAL LIGATION      There were no vitals filed for this visit.   Subjective Assessment - 02/27/20 1325    Subjective  I am driving now this week - feels good - still using the soft thumb splint - and still hard time picking up something heavy - like cannot do 1/2 gallon of milk -pinkie still sore in the joint    Pertinent History The patient suffered a fall on 12/19/2019, the patient was treated with a short arm cast. The patient is now close to 1 month status post initial injury, she has been in the short arm cast since her initial visit on 12/23/2019. The patient feels that she is doing well. She denies any  significant pain to the right wrist. She denies any numbness or tingling to the right upper extremity at today's visit. Denies any repeat trauma since she was last evaluated. Cast was removed on 01/16/2020 and put in wrist splint - refer to OT for hand therapy on 02/05/2020    Patient Stated Goals I want to be able to use my R hand to drive , paint again , open jars , packages , pick up and hold objects    Currently in Pain? No/denies              [name] OT Assessment - 02/27/20 0001      AROM   Right Wrist Extension 65 Degrees    Right Wrist Flexion 55 Degrees           Flexion of wrist increase since earlier this week with adding prolonged flexion stretch with use of heating pad          OT Treatments/Exercises (OP) - 02/27/20 0001      RUE Paraffin   Number Minutes Paraffin 8 Minutes    RUE Paraffin Location Wrist    Comments wrist flexion stretch 2 x 2 min inbetween ext  soft tissue using graston tool nr 2 for light sweeping on dorsal/volar wrist, forearm and hand  prior to AROM and AAROM of  L wrist flexion   AAROM for wrist over edge of table for wrist flexion, ext, RD,UD- need SBA for  RD, UD And pt to do double time for flexion for every extention 10 reps  Done this date in paraffin again - extended wrist flexion stretch- 2 x  2 min - pt to do at home using her heatingpad  Tendon glides - 10 reps 5th PIP increase edema 0.7 cm - and with decrease flexion and causing over flexion of MC - discomfort more than pain  Provided silicon sleeve for compression of 5th PIP - night time and some during day  Opposition to all digits- 10 reps Pain free- or slight pull or stretch less than 2/10  review 1 lbs for wrist in all planes - 12 reps - no pain - still min A for correct position  Can do 3 sets for all x 12 reps  - 2 x day  putty light blue for gripping- 3 x 12 reps 2 x day painfree          OT Education - 02/27/20 1327    Education Details  progress in AROM for wrist and use of hand , change to HEP    Person(s) Educated Patient    Methods Explanation;Demonstration;Tactile cues;Verbal cues;Handout    Comprehension Verbal cues required;Returned demonstration;Verbalized understanding            OT Short Term Goals - 02/12/20 1610      OT SHORT TERM GOAL #1   Title Pt to be independent in HEP to increase R wrist AROM and fisting to touch palm without increase symptoms    Baseline no knowledge of HEP    Time 3    Period Weeks    Status New    Target Date 03/04/20      OT SHORT TERM GOAL #2   Title R hand flexion increase for pt to touch palm to hold utencils, brush, toiletpaper    Baseline cannot make tight fist - increase edema and pain in 5th digit and over MC's - decrease AROM in all MC's and 5th see flowsheet    Time 3    Period Weeks    Status New    Target Date 03/04/20             OT Long Term Goals - 02/12/20 1613      OT LONG TERM GOAL #1   Title R wrist AROM increase to [facility] for pt to use dominant hand in more than 75% of ADL's and IADLs    Baseline Just starting using hand and wrist last week -only using hand 25% per funcion on PRWHE    Time 5    Period Weeks    Status New    Target Date 03/18/20      OT LONG TERM GOAL #2   Title R grip and prehension strength increase to more than 50% compare to L hand to change gear of car, turn doorknob , carry more than 4 lbs, without increase symptoms    Baseline NT - 8 wks out of injuury - but first visit - out of splint last week    Time 5    Period Weeks    Status New    Target Date 03/18/20  Plan - 02/27/20 1328    Clinical Impression Statement Pt is 10 wks out from R distal radius fx- pt cont to show increase AROM - with flexion still slowest and pain with AAROM - doing heat and prolonged flexion stretch - able to increase reps and sets with 1 lbs weight and light putty for gripping- cont to increase functioanl use    OT  Occupational Profile and History Problem Focused Assessment - Including review of records relating to presenting problem    Occupational performance deficits (Please refer to evaluation for details): ADL's;IADL's;Play;Leisure;Social Participation    Body Structure / Function / Physical Skills ADL;Edema;Flexibility;IADL;Pain;UE functional use;ROM;Strength    Rehab Potential Good    Clinical Decision Making Limited treatment options, no task modification necessary    Comorbidities Affecting Occupational Performance: May have comorbidities impacting occupational performance    Modification or Assistance to Complete Evaluation  No modification of tasks or assist necessary to complete eval    OT Frequency 2x / week    OT Duration 4 weeks    OT Treatment/Interventions Self-care/ADL training;Contrast Bath;Fluidtherapy;Therapeutic exercise;Manual Therapy;Passive range of motion;Therapeutic activities;Splinting;Patient/family education    Plan assess progress with HEP - modify as needed    Consulted and Agree with Plan of Care Patient           Patient will benefit from skilled therapeutic intervention in order to improve the following deficits and impairments:   Body Structure / Function / Physical Skills: ADL, Edema, Flexibility, IADL, Pain, UE functional use, ROM, Strength       Visit Diagnosis: Stiffness of right wrist, not elsewhere classified  Stiffness of right hand, not elsewhere classified  Muscle weakness (generalized)  Pain in right wrist  Pain in right hand    Problem List Patient Active Problem List   Diagnosis Date Noted  . Mixed hyperlipidemia 11/06/2014  . Osteoporosis 11/06/2014  . Allergic rhinitis 09/23/2013  . Cerebrovascular disease 09/23/2013    Rosalyn Gess OTR/L,CLT 02/27/2020, 1:32 PM  [facility] [address] [place], [address] Phone: [phone]   Fax:   [phone]  Name: Laura Ramos MRN: [id number] Date of Birth: 1932/08/31

## 2020-03-01 ENCOUNTER — Ambulatory Visit: Payer: PPO | Admitting: Occupational Therapy

## 2020-03-01 ENCOUNTER — Encounter: Payer: PPO | Admitting: Physical Therapy

## 2020-03-01 ENCOUNTER — Other Ambulatory Visit: Payer: Self-pay

## 2020-03-01 DIAGNOSIS — M25631 Stiffness of right wrist, not elsewhere classified: Secondary | ICD-10-CM | POA: Diagnosis not present

## 2020-03-01 DIAGNOSIS — M79641 Pain in right hand: Secondary | ICD-10-CM

## 2020-03-01 DIAGNOSIS — M6281 Muscle weakness (generalized): Secondary | ICD-10-CM

## 2020-03-01 DIAGNOSIS — M25531 Pain in right wrist: Secondary | ICD-10-CM

## 2020-03-01 DIAGNOSIS — M25641 Stiffness of right hand, not elsewhere classified: Secondary | ICD-10-CM

## 2020-03-01 NOTE — Therapy (Signed)
[facility] [address] [place], [address] Phone: [phone]   Fax:  [phone]  Occupational Therapy Treatment  Patient Details  Name: Laura Ramos MRN: [id number] Date of Birth: Apr 01, 1933 Referring Provider (OT): Mikle Bosworth   Encounter Date: 03/01/2020   OT End of Session - 03/01/20 7026    Visit Number 5    Number of Visits 10    Date for OT Re-Evaluation 03/18/20    OT Start Time 1345    OT Stop Time 1432    OT Time Calculation (min) 47 min    Activity Tolerance Patient tolerated treatment well    Behavior During Therapy [facility] for tasks assessed/performed           Past Medical History:  Diagnosis Date  . Allergy   . Artery disease, cerebral   . Arthritis   . Cancer ([place])    skin  . Hyperlipidemia   . Osteoporosis   . TIA (transient ischemic attack)    x2, last apprx 2007. no deficits  . Vertigo    none in several yrs  . Wears dentures    partial lower    Past Surgical History:  Procedure Laterality Date  . CATARACT EXTRACTION W/ INTRAOCULAR LENS  IMPLANT, BILATERAL    . COLONOSCOPY    . PTOSIS REPAIR Bilateral 04/10/2017   Procedure: BLEPHAROPTOSIS REPAIR RESECT EX;  Surgeon: Karle Starch, MD;  Location: [place];  Service: Ophthalmology;  Laterality: Bilateral;  . TUBAL LIGATION      There were no vitals filed for this visit.   Subjective Assessment - 03/01/20 1401    Subjective  I am using my hand and wrist more - do not hurt as much - driving , doing all my exercises - easier to bath - just no pushing up yet - still some pain with that    Pertinent History The patient suffered a fall on 12/19/2019, the patient was treated with a short arm cast. The patient is now close to 1 month status post initial injury, she has been in the short arm cast since her initial visit on 12/23/2019. The patient feels that she is doing well. She denies any significant pain to the right  wrist. She denies any numbness or tingling to the right upper extremity at today's visit. Denies any repeat trauma since she was last evaluated. Cast was removed on 01/16/2020 and put in wrist splint - refer to OT for hand therapy on 02/05/2020    Patient Stated Goals I want to be able to use my R hand to drive , paint again , open jars , packages , pick up and hold objects    Currently in Pain? No/denies              [facility] OT Assessment - 03/01/20 0001      AROM   Right Wrist Extension 65 Degrees    Right Wrist Flexion 60 Degrees    Right Wrist Radial Deviation 20 Degrees    Right Wrist Ulnar Deviation 30 Degrees            R Grip and prehension strength same as last time -  Upgrade pt to medium teal putty - for gripping - 1 x 12 reps  2 x day   no lat and 3 point  Pinch because of severe thumb CMC arthritis         OT Treatments/Exercises (OP) - 03/01/20 0001  RUE Paraffin   Number Minutes Paraffin 8 Minutes    RUE Paraffin Location Wrist    Comments wrist flexion stretch - 2 x 2 min             soft tissue using graston tool nr 2 for light sweeping ondorsal/volar wrist, forearmand hand prior to AROM and AAROM of  L wrist flexion/ extention    AAROM for wrist over edge of table for wrist flexion, ext, RD,UD And pt to do double time for flexion for every extention 10 reps Done and review with pt Table slides - 20 reps  -slight pull over volar wrist -pain free  Done this date in paraffin again - extended wrist flexion stretch- 2 x  2 min - pt to do at home using her heatingpad  Prior to [facility] and table slides   Tendon glides - 10 reps 5th PIP increase edema still - but pt report looked better and felt better this morning using her compression sleeve - cont to show decrease flexion at PIP  and causing over flexion of MC - discomfort more than pain still Cont  silicon sleeve for compression of 5th PIP - night time and some during day  Upgrade pt to 2  lbs for RD, UD, sup/ pro   1 set of 10 reps - but 2 x day  but cont 3 sets of 10 with 1 lbs for wrist flexion , extention  2 x day             OT Education - 03/01/20 1403    Education Details progress in AROM for wrist and use of hand , change to HEP    Person(s) Educated Patient    Methods Explanation;Demonstration;Tactile cues;Verbal cues;Handout    Comprehension Verbal cues required;Returned demonstration;Verbalized understanding            OT Short Term Goals - 02/12/20 1610      OT SHORT TERM GOAL #1   Title Pt to be independent in HEP to increase R wrist AROM and fisting to touch palm without increase symptoms    Baseline no knowledge of HEP    Time 3    Period Weeks    Status New    Target Date 03/04/20      OT SHORT TERM GOAL #2   Title R hand flexion increase for pt to touch palm to hold utencils, brush, toiletpaper    Baseline cannot make tight fist - increase edema and pain in 5th digit and over MC's - decrease AROM in all MC's and 5th see flowsheet    Time 3    Period Weeks    Status New    Target Date 03/04/20             OT Long Term Goals - 02/12/20 1613      OT LONG TERM GOAL #1   Title R wrist AROM increase to [facility] for pt to use dominant hand in more than 75% of ADL's and IADLs    Baseline Just starting using hand and wrist last week -only using hand 25% per funcion on PRWHE    Time 5    Period Weeks    Status New    Target Date 03/18/20      OT LONG TERM GOAL #2   Title R grip and prehension strength increase to more than 50% compare to L hand to change gear of car, turn doorknob , carry more than 4 lbs, without increase symptoms  Baseline NT - 8 wks out of injuury - but first visit - out of splint last week    Time 5    Period Weeks    Status New    Target Date 03/18/20                 Plan - 03/01/20 1403    Clinical Impression Statement Pt is 10 wks out from R distal radius fx - pt cont to show increase AROM for wrist  flexion , extention - initiate this date table slides to prepare for weight bearing thru palm - able to increase putty for grip and 2 lbs for some of the wrist HEP -- report increase functional use and strength in R hand and wrist    OT Occupational Profile and History Problem Focused Assessment - Including review of records relating to presenting problem    Occupational performance deficits (Please refer to evaluation for details): ADL's;IADL's;Play;Leisure;Social Participation    Body Structure / Function / Physical Skills ADL;Edema;Flexibility;IADL;Pain;UE functional use;ROM;Strength    Rehab Potential Good    Clinical Decision Making Limited treatment options, no task modification necessary    Comorbidities Affecting Occupational Performance: May have comorbidities impacting occupational performance    Modification or Assistance to Complete Evaluation  No modification of tasks or assist necessary to complete eval    OT Frequency 2x / week    OT Duration 4 weeks    OT Treatment/Interventions Self-care/ADL training;Contrast Bath;Fluidtherapy;Therapeutic exercise;Manual Therapy;Passive range of motion;Therapeutic activities;Splinting;Patient/family education    Plan assess progress with HEP - modify as needed    Consulted and Agree with Plan of Care Patient           Patient will benefit from skilled therapeutic intervention in order to improve the following deficits and impairments:   Body Structure / Function / Physical Skills: ADL, Edema, Flexibility, IADL, Pain, UE functional use, ROM, Strength       Visit Diagnosis: Stiffness of right wrist, not elsewhere classified  Stiffness of right hand, not elsewhere classified  Muscle weakness (generalized)  Pain in right wrist  Pain in right hand    Problem List Patient Active Problem List   Diagnosis Date Noted  . Mixed hyperlipidemia 11/06/2014  . Osteoporosis 11/06/2014  . Allergic rhinitis 09/23/2013  . Cerebrovascular  disease 09/23/2013    Rosalyn Gess OTR/L,CLT 03/01/2020, 2:39 PM  [place] [facility] [address], [place], [address] Phone: [phone]   Fax:  [phone]  Name: Laura Ramos MRN: [id number] Date of Birth: 10/06/1932

## 2020-03-03 ENCOUNTER — Encounter: Payer: PPO | Admitting: Physical Therapy

## 2020-03-05 ENCOUNTER — Ambulatory Visit: Payer: PPO | Admitting: Occupational Therapy

## 2020-03-05 ENCOUNTER — Other Ambulatory Visit: Payer: Self-pay

## 2020-03-05 DIAGNOSIS — M25631 Stiffness of right wrist, not elsewhere classified: Secondary | ICD-10-CM

## 2020-03-05 DIAGNOSIS — M25531 Pain in right wrist: Secondary | ICD-10-CM

## 2020-03-05 DIAGNOSIS — M6281 Muscle weakness (generalized): Secondary | ICD-10-CM

## 2020-03-05 DIAGNOSIS — M79641 Pain in right hand: Secondary | ICD-10-CM

## 2020-03-05 DIAGNOSIS — M25641 Stiffness of right hand, not elsewhere classified: Secondary | ICD-10-CM

## 2020-03-05 NOTE — Therapy (Signed)
[facility] [address] [place], [address] Phone: [phone]   Fax:  [phone]  Occupational Therapy Treatment  Patient Details  Name: Laura Ramos MRN: [id number] Date of Birth: 25-Dec-1932 Referring Provider (OT): Mikle Bosworth   Encounter Date: 03/05/2020   OT End of Session - 03/05/20 1123    Visit Number 6    Number of Visits 10    Date for OT Re-Evaluation 03/18/20    OT Start Time 1100    OT Stop Time 1156    OT Time Calculation (min) 56 min    Activity Tolerance Patient tolerated treatment well    Behavior During Therapy [facility] for tasks assessed/performed           Past Medical History:  Diagnosis Date  . Allergy   . Artery disease, cerebral   . Arthritis   . Cancer ([place])    skin  . Hyperlipidemia   . Osteoporosis   . TIA (transient ischemic attack)    x2, last apprx 2007. no deficits  . Vertigo    none in several yrs  . Wears dentures    partial lower    Past Surgical History:  Procedure Laterality Date  . CATARACT EXTRACTION W/ INTRAOCULAR LENS  IMPLANT, BILATERAL    . COLONOSCOPY    . PTOSIS REPAIR Bilateral 04/10/2017   Procedure: BLEPHAROPTOSIS REPAIR RESECT EX;  Surgeon: Karle Starch, MD;  Location: [place];  Service: Ophthalmology;  Laterality: Bilateral;  . TUBAL LIGATION      There were no vitals filed for this visit.   Subjective Assessment - 03/05/20 1116    Subjective  I am using my hand more- change gear, push to open make up , turn doorknob- just no picking up heavy things and pushing up    Pertinent History The patient suffered a fall on 12/19/2019, the patient was treated with a short arm cast. The patient is now close to 1 month status post initial injury, she has been in the short arm cast since her initial visit on 12/23/2019. The patient feels that she is doing well. She denies any significant pain to the right wrist. She denies any numbness or  tingling to the right upper extremity at today's visit. Denies any repeat trauma since she was last evaluated. Cast was removed on 01/16/2020 and put in wrist splint - refer to OT for hand therapy on 02/05/2020    Patient Stated Goals I want to be able to use my R hand to drive , paint again , open jars , packages , pick up and hold objects    Currently in Pain? No/denies              [facility] OT Assessment - 03/05/20 0001      Strength   Right Hand Grip (lbs) 19    Right Hand Lateral Pinch 6 lbs    Right Hand 3 Point Pinch 6 lbs    Left Hand Grip (lbs) 29    Left Hand Lateral Pinch 9 lbs    Left Hand 3 Point Pinch 9 lbs             Pt showed increase grip strength in bilateral hands - pt's grip strength were decrease at eval in L hand too Prehension increase to 6 lbs- limited by [facility] arthritis         OT Treatments/Exercises (OP) - 03/05/20 0001      RUE Paraffin  Number Minutes Paraffin 8 Minutes    RUE Paraffin Location Wrist    Comments wrist flexion stretch 2 x 2 min - rest inbetween             soft tissue using graston tool nr 2 for light sweeping ondorsal/volar wrist, forearmand hand prior to AAROMof L wrist flexion/ extention    AAROM and prolonged flexion stretch by OT for wrist over edge of table for wrist flexion, ext And pt to do double time for flexion for every extention 10 reps Done and review with pt Table slides - 20 reps  -slight pull over volar wrist -pain free Attempted push up from wall - had increase pain Cont table slides at home  Pt was able to carry 3 lbs with slight pull at side- was able to do 4 lbs but pull increase on ulnar and dorsal side of wrist  Able to turn doorknob   Pt to cont at home extended wrist flexion stretch- 2 x2 min -  using her heatingpad  Prior to [employer] and table slides   Tendon glides - 10 reps 5th PIP increase edema still - but pt report looked better and felt better this morning using her compression  sleeve - cont to show decrease flexion at PIP  and causing over flexion of MC - discomfort more than pain still Cont  silicon sleeve for compression of 5th PIP - night time and some during day  Cont with 2 lbs for RD, UD, sup/ pro   increase to 2 sets 12 reps - 2 x day Can increase if no pain - in 3 days to 3 sets   but cont 3 sets of 10 with 1 lbs for wrist flexion , extention  2 x day   Increase her putty to teal med last time but just to do gripping  increase to 2 sets of 12 reps- 2 x day  Can increase to 3rd set in 3 days if no pain          OT Education - 03/05/20 1123    Education Details progress in AROM for wrist and use of hand , change to HEP    Person(s) Educated Patient    Methods Explanation;Demonstration;Tactile cues;Verbal cues;Handout    Comprehension Verbal cues required;Returned demonstration;Verbalized understanding            OT Short Term Goals - 02/12/20 1610      OT SHORT TERM GOAL #1   Title Pt to be independent in HEP to increase R wrist AROM and fisting to touch palm without increase symptoms    Baseline no knowledge of HEP    Time 3    Period Weeks    Status New    Target Date 03/04/20      OT SHORT TERM GOAL #2   Title R hand flexion increase for pt to touch palm to hold utencils, brush, toiletpaper    Baseline cannot make tight fist - increase edema and pain in 5th digit and over MC's - decrease AROM in all MC's and 5th see flowsheet    Time 3    Period Weeks    Status New    Target Date 03/04/20             OT Long Term Goals - 02/12/20 1613      OT LONG TERM GOAL #1   Title R wrist AROM increase to [facility] for pt to use dominant hand in more than 75% of  ADL's and IADLs    Baseline Just starting using hand and wrist last week -only using hand 25% per funcion on PRWHE    Time 5    Period Weeks    Status New    Target Date 03/18/20      OT LONG TERM GOAL #2   Title R grip and prehension strength increase to more than 50% compare  to L hand to change gear of car, turn doorknob , carry more than 4 lbs, without increase symptoms    Baseline NT - 8 wks out of injuury - but first visit - out of splint last week    Time 5    Period Weeks    Status New    Target Date 03/18/20                 Plan - 03/05/20 1204    Clinical Impression Statement Pt is 10 1/2 wks out from R distal radius fx - pt cont to show increase strength - able to carry 3 lbs - but increase pain with 4 lbs - Thumb CMC arthritis limting her - but cont to increase AROM for flexion , extention -still 1 lbs for ext, flexion but 2 lbs for wrist in other planes- grip increased - cont to increase pt ROM and strength - to increase functional use    OT Occupational Profile and History Problem Focused Assessment - Including review of records relating to presenting problem    Occupational performance deficits (Please refer to evaluation for details): ADL's;IADL's;Play;Leisure;Social Participation    Body Structure / Function / Physical Skills ADL;Edema;Flexibility;IADL;Pain;UE functional use;ROM;Strength    Rehab Potential Good    Clinical Decision Making Limited treatment options, no task modification necessary    Comorbidities Affecting Occupational Performance: May have comorbidities impacting occupational performance    Modification or Assistance to Complete Evaluation  No modification of tasks or assist necessary to complete eval    OT Frequency 2x / week    OT Duration 4 weeks    OT Treatment/Interventions Self-care/ADL training;Contrast Bath;Fluidtherapy;Therapeutic exercise;Manual Therapy;Passive range of motion;Therapeutic activities;Splinting;Patient/family education    Plan assess progress with HEP - modify as needed    Consulted and Agree with Plan of Care Patient           Patient will benefit from skilled therapeutic intervention in order to improve the following deficits and impairments:   Body Structure / Function / Physical Skills: ADL,  Edema, Flexibility, IADL, Pain, UE functional use, ROM, Strength       Visit Diagnosis: Stiffness of right hand, not elsewhere classified  Muscle weakness (generalized)  Pain in right wrist  Pain in right hand  Stiffness of right wrist, not elsewhere classified    Problem List Patient Active Problem List   Diagnosis Date Noted  . Mixed hyperlipidemia 11/06/2014  . Osteoporosis 11/06/2014  . Allergic rhinitis 09/23/2013  . Cerebrovascular disease 09/23/2013    Rosalyn Gess OTR/l,CLT 03/05/2020, 12:11 PM  [place] [facility] [address] [place], [address] Phone: [phone]   Fax:  [phone]  Name: Laura Ramos MRN: [id number] Date of Birth: 1932/07/13

## 2020-03-09 ENCOUNTER — Other Ambulatory Visit: Payer: Self-pay

## 2020-03-09 ENCOUNTER — Ambulatory Visit: Payer: PPO | Admitting: Occupational Therapy

## 2020-03-09 DIAGNOSIS — M79641 Pain in right hand: Secondary | ICD-10-CM

## 2020-03-09 DIAGNOSIS — M25641 Stiffness of right hand, not elsewhere classified: Secondary | ICD-10-CM

## 2020-03-09 DIAGNOSIS — M6281 Muscle weakness (generalized): Secondary | ICD-10-CM

## 2020-03-09 DIAGNOSIS — M25631 Stiffness of right wrist, not elsewhere classified: Secondary | ICD-10-CM

## 2020-03-09 DIAGNOSIS — M25531 Pain in right wrist: Secondary | ICD-10-CM

## 2020-03-10 NOTE — Therapy (Signed)
[facility] [address], [place], [address] Phone: [phone]   Fax:  [phone]  Occupational Therapy Treatment  Patient Details  Name: Laura Ramos MRN: [id number] Date of Birth: 10/04/1932 Referring Provider (OT): Mikle Bosworth   Encounter Date: 03/09/2020   OT End of Session - 03/09/20 1600    Visit Number 7    Number of Visits 10    Date for OT Re-Evaluation 03/18/20    OT Start Time 1345    OT Stop Time 1428    OT Time Calculation (min) 43 min    Activity Tolerance Patient tolerated treatment well    Behavior During Therapy [facility] for tasks assessed/performed           Past Medical History:  Diagnosis Date  . Allergy   . Artery disease, cerebral   . Arthritis   . Cancer ([place])    skin  . Hyperlipidemia   . Osteoporosis   . TIA (transient ischemic attack)    x2, last apprx 2007. no deficits  . Vertigo    none in several yrs  . Wears dentures    partial lower    Past Surgical History:  Procedure Laterality Date  . CATARACT EXTRACTION W/ INTRAOCULAR LENS  IMPLANT, BILATERAL    . COLONOSCOPY    . PTOSIS REPAIR Bilateral 04/10/2017   Procedure: BLEPHAROPTOSIS REPAIR RESECT EX;  Surgeon: Karle Starch, MD;  Location: [facility];  Service: Ophthalmology;  Laterality: Bilateral;  . TUBAL LIGATION      There were no vitals filed for this visit.   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 The patient is now close to 1 month status post initial injury, she has been in the short arm cast since her initial visit on 12/23/2019. The patient feels that she is doing  well. She denies any significant pain to the right wrist. She denies any numbness or tingling to the right upper extremity at today's visit. Denies any repeat trauma since she was last evaluated. Cast was removed on 01/16/2020 and put in wrist splint - refer to OT for hand therapy on 02/05/2020    Currently in Pain? No/denies              AROM for wrist flexion , extention about the same- with some discomfort dorsal wrist with wrist flexion , and burning sensation on volar wrist from old hematoma years ago Pt has bilateral decrease grip strength - but increase bilaterally using putty Pt with cut  Over [facility] - with band aid on - done heating pad this date instead of paraffin           OT Treatments/Exercises (OP) - 03/10/20 0001      Moist Heat Therapy   Number Minutes Moist Heat 8 Minutes    Moist Heat Location Wrist   wrist flexion, ext stretch 2 x 2 min           soft tissue using graston tool nr 2 for light sweeping ondorsal/volar wrist, forearmand hand prior  to [facility] L wrist flexion/ extention  AAROM  for flexion and extention of  wrist over edge of table reviewed with pt- hold 3-5 sec- and work on end range - and shifting body to increase stretch 10 reps Done and review againTable slides - 20 reps -slight pull/burning feeling over volar wrist ( old hematoma) -pain free Attempted push up from wall last time- had increase pain Cont table slides at home  Pt was able to carry 3 lbs and 4 lbs - but feel slight pull on ulnar and dorsal wrist   Pt to cont at home extended wrist flexion stretch- 2 x2 min -  using her heatingpad Prior to [employer] and table slides  Tendon glides - 10 reps 5th PIP increase edemastill but improved greatly - pt report 5th digit feels better in the morning using her compression sleeve-show increase AROM - but still tenderness on lateral 5th PIP  - discomfort more than pain still Contsilicon sleeve for compression of 5th PIP -  night time and some during day  Cont with 2 lbs for RD, UD, sup/ pro  increase to 3 sets 12 reps - 2 x day Upgrade to 2 lbs for wrist flexion , extention too - but only 1 set of 12 pain free  - can increase to 2nd set in 3-4 days if no symptoms 2 x day  Increase her putty resistance - add some 1/2 firm green to medium teal - cont to do only gripping because of some CMC arthritis and volar wrist discomfort at old hematoma site  increase to 2 sets of 12 reps in 3-4 days- 2 x day         OT Education - 03/10/20 0906    Education Details progress in AROM for wrist and use of hand , change to HEP    Person(s) Educated Patient    Methods Explanation;Demonstration;Tactile cues;Verbal cues;Handout    Comprehension Verbal cues required;Returned demonstration;Verbalized understanding            OT Short Term Goals - 03/10/20 0910      OT SHORT TERM GOAL #1   Title Pt to be independent in HEP to increase R wrist AROM and fisting to touch palm without increase symptoms    Status Achieved      OT SHORT TERM GOAL #2   Title R hand flexion increase for pt to touch palm to hold utencils, brush, toiletpaper    Status Achieved             OT Long Term Goals - 03/10/20 0911      OT LONG TERM GOAL #1   Title R wrist AROM increase to [facility] for pt to use dominant hand in more than 75% of ADL's and IADLs    Baseline Just starting using hand and wrist last week -only using hand 25% per funcion on PRWHE - improved greatly - will reassess PRWHE next visit -    Time 1    Period Weeks    Status On-going    Target Date 03/18/20      OT LONG TERM GOAL #2   Title R grip and prehension strength increase to more than 50% compare to L hand to change gear of car, turn doorknob , carry more than 4 lbs, without increase symptoms    Baseline Grip on R 19, L 29 - prehension R 6 lbs, L 9 lbs - some increase symptoms with 4 lbs- but 3 no pain - can change  gear and turn doorknob without issues or splint     Time 1    Period Weeks    Status On-going    Target Date 03/18/20                 Plan - 03/09/20 1600    Clinical Impression Statement Pt is about 11 wks out from R distal radius fx - pt cont to show increase strength in wrist and grip - able to carry 3-4 lbs without pain - she do have Thumb CMC arthritis - wrist flexion still limitd by pain over dorsal wrist , and weight bearing/gripping because of some discomfort at old hematoma she had on volar wrist years ago - pt to cont increase ROM/ strenghtening without increasing symptoms - Improved greatly in functional use    OT Occupational Profile and History Problem Focused Assessment - Including review of records relating to presenting problem    Occupational performance deficits (Please refer to evaluation for details): ADL's;IADL's;Play;Leisure;Social Participation    Body Structure / Function / Physical Skills ADL;Edema;Flexibility;IADL;Pain;UE functional use;ROM;Strength    Modification or Assistance to Complete Evaluation  No modification of tasks or assist necessary to complete eval    OT Frequency 2x / week    OT Duration 4 weeks    OT Treatment/Interventions Self-care/ADL training;Contrast Bath;Fluidtherapy;Therapeutic exercise;Manual Therapy;Passive range of motion;Therapeutic activities;Splinting;Patient/family education    Consulted and Agree with Plan of Care Patient           Patient will benefit from skilled therapeutic intervention in order to improve the following deficits and impairments:   Body Structure / Function / Physical Skills: ADL, Edema, Flexibility, IADL, Pain, UE functional use, ROM, Strength       Visit Diagnosis: Stiffness of right hand, not elsewhere classified  Muscle weakness (generalized)  Pain in right wrist  Pain in right hand  Stiffness of right wrist, not elsewhere classified    Problem List Patient Active Problem List   Diagnosis Date Noted  . Mixed hyperlipidemia 11/06/2014    . Osteoporosis 11/06/2014  . Allergic rhinitis 09/23/2013  . Cerebrovascular disease 09/23/2013    Rosalyn Gess OTR/L,CLT 03/10/2020, 9:14 AM  [facility] [address], [place], [address] Phone: [phone]   Fax:  [phone]  Name: Laura Ramos MRN: [id number] Date of Birth: 06-16-1932

## 2020-03-12 DIAGNOSIS — S52571D Other intraarticular fracture of lower end of right radius, subsequent encounter for closed fracture with routine healing: Secondary | ICD-10-CM | POA: Diagnosis not present

## 2020-03-15 ENCOUNTER — Ambulatory Visit: Payer: PPO | Admitting: Occupational Therapy

## 2020-03-15 ENCOUNTER — Other Ambulatory Visit: Payer: Self-pay

## 2020-03-15 DIAGNOSIS — M25631 Stiffness of right wrist, not elsewhere classified: Secondary | ICD-10-CM | POA: Diagnosis not present

## 2020-03-15 DIAGNOSIS — M79641 Pain in right hand: Secondary | ICD-10-CM

## 2020-03-15 DIAGNOSIS — M6281 Muscle weakness (generalized): Secondary | ICD-10-CM

## 2020-03-15 DIAGNOSIS — M25531 Pain in right wrist: Secondary | ICD-10-CM

## 2020-03-15 DIAGNOSIS — M25641 Stiffness of right hand, not elsewhere classified: Secondary | ICD-10-CM

## 2020-03-15 NOTE — Therapy (Signed)
[facility] [address] [place], [address] Phone: [phone]   Fax:  [phone]  Occupational Therapy Treatment  Patient Details  Name: Laura Ramos MRN: [id number] Date of Birth: [birth year] Referring Provider (OT): Mikle Bosworth   Encounter Date: 03/15/2020   OT End of Session - 03/15/20 1761    Visit Number 8    Number of Visits 10    Date for OT Re-Evaluation 03/18/20    OT Start Time 1345    OT Stop Time 1434    OT Time Calculation (min) 49 min    Activity Tolerance Patient tolerated treatment well    Behavior During Therapy [facility] for tasks assessed/performed           Past Medical History:  Diagnosis Date  . Allergy   . Artery disease, cerebral   . Arthritis   . Cancer ([place])    skin  . Hyperlipidemia   . Osteoporosis   . TIA (transient ischemic attack)    x2, last apprx 2007. no deficits  . Vertigo    none in several yrs  . Wears dentures    partial lower    Past Surgical History:  Procedure Laterality Date  . CATARACT EXTRACTION W/ INTRAOCULAR LENS  IMPLANT, BILATERAL    . COLONOSCOPY    . PTOSIS REPAIR Bilateral 04/10/2017   Procedure: BLEPHAROPTOSIS REPAIR RESECT EX;  Surgeon: Karle Starch, MD;  Location: [facility];  Service: Ophthalmology;  Laterality: Bilateral;  . TUBAL LIGATION      There were no vitals filed for this visit.   Subjective Assessment - 03/15/20 1641    Subjective  I seen Laura Ramos and done xray and said I am doing great and can cont with you and the exercises you giving me - feel more strength -but the putty still hard - no pushing up yet or gripping or carrying anything heavy    Pertinent History The patient suffered a fall on 12/19/2019, the patient was treated with a short arm cast. The patient is now close to 1 month status post initial injury, she has been in the short arm cast since her initial visit on 12/23/2019. The patient feels that she is doing  well. She denies any significant pain to the right wrist. She denies any numbness or tingling to the right upper extremity at today's visit. Denies any repeat trauma since she was last evaluated. Cast was removed on 01/16/2020 and put in wrist splint - refer to OT for hand therapy on 02/05/2020    Patient Stated Goals I want to be able to use my R hand to drive , paint again , open jars , packages , pick up and hold objects    Currently in Pain? No/denies              [facility] OT Assessment - 03/15/20 0001      AROM   Right Wrist Extension 65 Degrees    Right Wrist Flexion 70 Degrees    Right Wrist Radial Deviation 20 Degrees    Right Wrist Ulnar Deviation 30 Degrees      Strength   Right Hand Grip (lbs) 20    Right Hand Lateral Pinch 7 lbs    Right Hand 3 Point Pinch 8 lbs    Left Hand Grip (lbs) 30    Left Hand Lateral Pinch 10 lbs    Left Hand 3 Point Pinch 9 lbs  x-rays  From visit with MD last week - demonstrate evidence of a mildly impacted distal radius fracture. There does not appear to be any significant volar or dorsal angulation. There is no increase in the impaction. Radial angulation is still intact. Excellent callus formation on today's x-rays. No other acute injury can be visualized. 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    That will explain some of pt symptoms with wrist flexion stretch - over scapholunate -and with weight bearing - pt able to do table slides  But also limited by [facility] arthritis - but did show increase prehension strength this date      Pt was able to carry  4 and lift on and off table 4 lbs - slight pull 1/10 -  Able to carry with slight pull of 1-2/10 on ulnar wrist  But not able to lift on and off table  - compensate  Add this date  2lbs for elbow flexion and extention - and shoulder flexion and punches 12 reps each  1 x day   Pt to  cont at home wrist flexion stretch- 2 x2 min - using her heatingpad Prior to [employer] and table slides  Tendon glides - 10 reps 5th PIP increase edemastill but improved greatly - pt report 5th digit feels better in the morning using her compression sleeve-show increase AROM - but still tenderness on lateral 5th PIP  - discomfort more than pain still Contsilicon sleeve for compression of 5th PIP - night time and some during day  Cont with2 lbs for RD, UD, sup/ pro increase to 3 sets 15reps - 2 x day  2 lbs for wrist flexion , extention too but 2 sets of 12 - 15 reps  Pain free 2 x day  Increase her putty resistance last time - pt report it is still hard - add  1/2 firm green to medium teal - cont to do only gripping because of some CMC arthritis and volar wrist discomfort at old hematoma site   2 sets of 12 reps 2 x day  Pain free       OT Treatments/Exercises (OP) - 03/15/20 0001      RUE Paraffin   Number Minutes Paraffin 8 Minutes    RUE Paraffin Location Wrist    Comments prior to stretches and soft tissue mobs             soft tissue using graston tool nr 2 for light sweeping ondorsal/volar wrist, forearmand hand prior to AAROMof L wrist flexion/ extention  AAROM for flexion and extention of  wrist over edge of table reviewed with pt- hold 3-5 sec- and work on end range - and shifting body to increase stretch 10 reps Done and review againTable slides - 20 reps -slight pull/burning feeling over volar wrist ( old hematoma) -pain free Attempted push up from wall last time- had increase pain Cont table slides at home        OT Education - 03/15/20 1642    Education Details add some elbow and shoulder HEP for pt using 2 lbs    Person(s) Educated Patient    Methods Explanation;Demonstration;Tactile cues;Verbal cues;Handout    Comprehension Verbal cues required;Returned demonstration;Verbalized understanding            OT Short Term  Goals - 03/10/20 0910      OT SHORT TERM GOAL #1   Title Pt to be  independent in HEP to increase R wrist AROM and fisting to touch palm without increase symptoms    Status Achieved      OT SHORT TERM GOAL #2   Title R hand flexion increase for pt to touch palm to hold utencils, brush, toiletpaper    Status Achieved             OT Long Term Goals - 03/10/20 0911      OT LONG TERM GOAL #1   Title R wrist AROM increase to [facility] for pt to use dominant hand in more than 75% of ADL's and IADLs    Baseline Just starting using hand and wrist last week -only using hand 25% per funcion on PRWHE - improved greatly - will reassess PRWHE next visit -    Time 1    Period Weeks    Status On-going    Target Date 03/18/20      OT LONG TERM GOAL #2   Title R grip and prehension strength increase to more than 50% compare to L hand to change gear of car, turn doorknob , carry more than 4 lbs, without increase symptoms    Baseline Grip on R 19, L 29 - prehension R 6 lbs, L 9 lbs - some increase symptoms with 4 lbs- but 3 no pain - can change gear and turn doorknob without issues or splint    Time 1    Period Weeks    Status On-going    Target Date 03/18/20                 Plan - 03/15/20 1643    Clinical Impression Statement pt is about 12 wks out from R distal radius fx - pt show increase prehension strength in R hand and grip increased to 20 lbs , wrist still decrease in supination ,and RD- able to carry about 5 lbs but can only pick up to waist height 4 lbs - add some elbow and shoulder exercises using 2 lbs - cont same putty for grip and prehension- cont to increase wrist flexion ROM and wrist extention to prepare for weight bearing - cont to have pain with weight bearing thru palm    OT Occupational Profile and History Problem Focused Assessment - Including review of records relating to presenting problem    Occupational performance deficits (Please refer to evaluation for details):  ADL's;IADL's;Play;Leisure;Social Participation    Body Structure / Function / Physical Skills ADL;Edema;Flexibility;IADL;Pain;UE functional use;ROM;Strength    Rehab Potential Good    Clinical Decision Making Limited treatment options, no task modification necessary    Comorbidities Affecting Occupational Performance: May have comorbidities impacting occupational performance    Modification or Assistance to Complete Evaluation  No modification of tasks or assist necessary to complete eval    OT Frequency 1x / week    OT Duration 4 weeks    OT Treatment/Interventions Self-care/ADL training;Contrast Bath;Fluidtherapy;Therapeutic exercise;Manual Therapy;Passive range of motion;Therapeutic activities;Splinting;Patient/family education    Plan assess progress with HEP - modify as needed    Consulted and Agree with Plan of Care Patient           Patient will benefit from skilled therapeutic intervention in order to improve the following deficits and impairments:   Body Structure / Function / Physical Skills: ADL, Edema, Flexibility, IADL, Pain, UE functional use, ROM, Strength       Visit Diagnosis: Stiffness of right hand, not elsewhere classified  Muscle weakness (generalized)  Pain in right hand  Pain in right wrist  Stiffness of right wrist, not elsewhere classified    Problem List Patient Active Problem List   Diagnosis Date Noted  . Mixed hyperlipidemia 11/06/2014  . Osteoporosis 11/06/2014  . Allergic rhinitis 09/23/2013  . Cerebrovascular disease 09/23/2013    Rosalyn Gess OTR/L,CLT 03/15/2020, 4:47 PM  [facility] [address], [place], [address] Phone: [phone]   Fax:  [phone]  Name: HAVANAH NELMS MRN: [id number] Date of Birth: [birth year]

## 2020-03-22 ENCOUNTER — Ambulatory Visit: Payer: PPO | Admitting: Occupational Therapy

## 2020-03-22 ENCOUNTER — Other Ambulatory Visit: Payer: Self-pay

## 2020-03-22 DIAGNOSIS — M25631 Stiffness of right wrist, not elsewhere classified: Secondary | ICD-10-CM

## 2020-03-22 DIAGNOSIS — M25641 Stiffness of right hand, not elsewhere classified: Secondary | ICD-10-CM

## 2020-03-22 DIAGNOSIS — M6281 Muscle weakness (generalized): Secondary | ICD-10-CM

## 2020-03-22 DIAGNOSIS — M79641 Pain in right hand: Secondary | ICD-10-CM

## 2020-03-22 DIAGNOSIS — M25531 Pain in right wrist: Secondary | ICD-10-CM

## 2020-03-22 NOTE — Therapy (Signed)
[facility] [address], [place], [address] Phone: [phone]   Fax:  [phone]  Occupational Therapy Treatment  Patient Details  Name: Laura Ramos MRN: [id number] Date of Birth: [birth year] Referring Provider (OT): Mikle Bosworth   Encounter Date: 03/22/2020   OT End of Session - 03/22/20 1409    Visit Number 9    Number of Visits 10    Date for OT Re-Evaluation 04/12/20    OT Start Time 1300    OT Stop Time 1348    OT Time Calculation (min) 48 min    Activity Tolerance Patient tolerated treatment well    Behavior During Therapy [facility] for tasks assessed/performed           Past Medical History:  Diagnosis Date  . Allergy   . Artery disease, cerebral   . Arthritis   . Cancer ([place])    skin  . Hyperlipidemia   . Osteoporosis   . TIA (transient ischemic attack)    x2, last apprx 2007. no deficits  . Vertigo    none in several yrs  . Wears dentures    partial lower    Past Surgical History:  Procedure Laterality Date  . CATARACT EXTRACTION W/ INTRAOCULAR LENS  IMPLANT, BILATERAL    . COLONOSCOPY    . PTOSIS REPAIR Bilateral 04/10/2017   Procedure: BLEPHAROPTOSIS REPAIR RESECT EX;  Surgeon: Karle Starch, MD;  Location: [facility];  Service: Ophthalmology;  Laterality: Bilateral;  . TUBAL LIGATION      There were no vitals filed for this visit.   Subjective Assessment - 03/22/20 1407    Subjective  I can tell I am getting stronger - using my hand more and doing things- but it is amazing how weak I got just from this fracture- did not try to push up with hand ,and pick up anything heavy - not even 1/2 gallon of milk - did do the laundry this week    Pertinent History The patient suffered a fall on 12/19/2019, the patient was treated with a short arm cast. The patient is now close to 1 month status post initial injury, she has been in the short arm cast since her initial visit on  12/23/2019. The patient feels that she is doing well. She denies any significant pain to the right wrist. She denies any numbness or tingling to the right upper extremity at today's visit. Denies any repeat trauma since she was last evaluated. Cast was removed on 01/16/2020 and put in wrist splint - refer to OT for hand therapy on 02/05/2020    Patient Stated Goals I want to be able to use my R hand to drive , paint again , open jars , packages , pick up and hold objects    Currently in Pain? Yes    Pain Score 2     Pain Location Wrist    Pain Orientation Right    Pain Descriptors / Indicators Tightness;Sore;Aching    Pain Type Acute pain    Pain Onset More than a month ago    Aggravating Factors  pushing up , carry or lift more than 4 lbs              [facility] OT Assessment - 03/22/20 0001      AROM   Right Wrist Extension 65 Degrees    Right Wrist Flexion 70 Degrees    Right Wrist Radial Deviation 20 Degrees  Right Wrist Ulnar Deviation 30 Degrees    Left Wrist Extension 70 Degrees    Left Wrist Flexion 80 Degrees    Left Wrist Radial Deviation 20 Degrees    Left Wrist Ulnar Deviation 30 Degrees      Strength   Right Hand Grip (lbs) 23    Right Hand Lateral Pinch 7 lbs    Right Hand 3 Point Pinch 8 lbs    Left Hand Grip (lbs) 30    Left Hand Lateral Pinch 10 lbs    Left Hand 3 Point Pinch 9 lbs           AROM for R wrist flexion , extention about same  RD, UD, sup and pro WNL Grip increase 3 lbs - prehension about same as last time    Pt was able to carry  4 and lift on and off table 4 lbs - slight pull 1/10 -  Able to carry with slight pull of 1-2/10 on ulnar wrist  But not able to lift on and off table 5 lbs   - compensate       OT Treatments/Exercises (OP) - 03/22/20 0001      Moist Heat Therapy   Number Minutes Moist Heat 8 Minutes    Moist Heat Location Wrist   flexion stretch for wrist            x-rays  From visit with MD 2 weeks ago - demonstrate  evidence of a mildly impacted distal radius fracture. There does not appear to be any significant volar or dorsal angulation. There is no increase in the impaction. Radial angulation is still intact. Excellent callus formation on today's x-rays. No other acute injury can be visualized. 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   That will explain some of pt symptoms with wrist flexion stretch - over scapholunate -and with weight bearing - pt able to do table slides  But also limited by [facility] arthritis - but did show increase grip strength this date    Upgrade and add this date 3 lbs for elbow flexion and extention - and shoulder flexion and punches 12 reps each  1 x day  But no on pool days Can increase end of week if no issues - 2 sets  And at pool can ask instructor for light weight to use in had - for aquatics  Pt to cont at home wrist flexion stretch- 2 x2 min - using her heatingpad Prior to [facility] and table slides Wall push up - 10 reps - pain free  Can try pushing up from  Chair- 40% on R , 60% on R with pillow in seat - increase gradually reps, sets - and taking more weight  8 reps this date   Cont as needed Tendon glides - 10 reps 5th PIP increase edemastillbut improved greatly- ptreport 5th digit feels better in themorning using her compression sleeve-show increase AROM - but still tenderness on lateral 5th PIP- discomfort more than pain still Contsilicon sleeve for compression of 5th PIP - night time and some during day  Cont with2 lbs for RD, UD, sup/ proflexion, ext wrist   3 sets15reps - 2 x day    Keep pt on same putty resistance - 1/2 firm green to mediumteal- cont to do gripping but add some 3 point pinch pulling of putty 12 reps pain free- with thumb in bottom and then on  top  1-2 sets of 12 reps 1 x day Can increase set in 5 days if no  increase pain Pain free Follow up in 3 wks         OT Education - 03/22/20 1409    Education Details add some elbow and shoulder HEP upgrade to 3 lbs    Person(s) Educated Patient    Methods Explanation;Demonstration;Tactile cues;Verbal cues;Handout    Comprehension Verbal cues required;Returned demonstration;Verbalized understanding            OT Short Term Goals - 03/22/20 1412      OT SHORT TERM GOAL #1   Title Pt to be independent in HEP to increase R wrist AROM and fisting to touch palm without increase symptoms    Status Achieved      OT SHORT TERM GOAL #2   Title R hand flexion increase for pt to touch palm to hold utencils, brush, toiletpaper    Status Achieved             OT Long Term Goals - 03/22/20 1412      OT LONG TERM GOAL #1   Title R wrist AROM increase to [facility] for pt to use dominant hand in more than 75% of ADL's and IADLs    Baseline cannot pick up or carry objects more than 4 lbs, cannot push up - favor - flexion 70, wrist ext 65    Time 3    Period Weeks    Status On-going    Target Date 04/12/20      OT LONG TERM GOAL #2   Title R grip and prehension strength increase to more than 50% compare to L hand to change gear of car, turn doorknob , carry more than 4 lbs, without increase symptoms    Status Achieved                 Plan - 03/22/20 1410    Clinical Impression Statement Pt is now about 13 wks out from R distal radius fx- cont to show increase grip strength - and functional use - cont to have decrease wrist flexion , ext with some discomfort- and able to cary and lift 4 lbs - but some pain and pull with 5 lbs- decrease strength proximal in R UE - and unable to push up from chair - could do after table slides for wrist extention - wall pushups with more ease - pt to cont with HEP for 3 wks and return for check up    OT Occupational Profile and History Problem Focused Assessment - Including review of records relating to presenting  problem    Occupational performance deficits (Please refer to evaluation for details): ADL's;IADL's;Play;Leisure;Social Participation    Body Structure / Function / Physical Skills ADL;Edema;Flexibility;IADL;Pain;UE functional use;ROM;Strength    Rehab Potential Good    Clinical Decision Making Limited treatment options, no task modification necessary    Comorbidities Affecting Occupational Performance: May have comorbidities impacting occupational performance    Modification or Assistance to Complete Evaluation  No modification of tasks or assist necessary to complete eval    OT Frequency --   3 wks   OT Duration --   3 wks   OT Treatment/Interventions Self-care/ADL training;Contrast Bath;Fluidtherapy;Therapeutic exercise;Manual Therapy;Passive range of motion;Therapeutic activities;Splinting;Patient/family education    Plan assess progress with HEP - modify as needed    Consulted and Agree with Plan of Care Patient           Patient will benefit  from skilled therapeutic intervention in order to improve the following deficits and impairments:   Body Structure / Function / Physical Skills: ADL, Edema, Flexibility, IADL, Pain, UE functional use, ROM, Strength       Visit Diagnosis: Stiffness of right hand, not elsewhere classified - Plan: Ot plan of care cert/re-cert  Muscle weakness (generalized) - Plan: Ot plan of care cert/re-cert  Pain in right hand - Plan: Ot plan of care cert/re-cert  Pain in right wrist - Plan: Ot plan of care cert/re-cert  Stiffness of right wrist, not elsewhere classified - Plan: Ot plan of care cert/re-cert    Problem List Patient Active Problem List   Diagnosis Date Noted  . Mixed hyperlipidemia 11/06/2014  . Osteoporosis 11/06/2014  . Allergic rhinitis 09/23/2013  . Cerebrovascular disease 09/23/2013    Rosalyn Gess OTR/l,CLT 03/22/2020, 2:16 PM  [facility] [address], [place], [address] Phone: [phone]   Fax:  [phone]  Name: Laura Ramos MRN: [id number] Date of Birth: 1932/09/05

## 2020-03-25 DIAGNOSIS — L821 Other seborrheic keratosis: Secondary | ICD-10-CM | POA: Diagnosis not present

## 2020-03-25 DIAGNOSIS — D0439 Carcinoma in situ of skin of other parts of face: Secondary | ICD-10-CM | POA: Diagnosis not present

## 2020-03-25 DIAGNOSIS — D485 Neoplasm of uncertain behavior of skin: Secondary | ICD-10-CM | POA: Diagnosis not present

## 2020-04-08 ENCOUNTER — Ambulatory Visit: Payer: PPO | Attending: Student | Admitting: Occupational Therapy

## 2020-04-08 ENCOUNTER — Other Ambulatory Visit: Payer: Self-pay

## 2020-04-08 DIAGNOSIS — M25531 Pain in right wrist: Secondary | ICD-10-CM

## 2020-04-08 DIAGNOSIS — M79641 Pain in right hand: Secondary | ICD-10-CM | POA: Diagnosis not present

## 2020-04-08 DIAGNOSIS — M25641 Stiffness of right hand, not elsewhere classified: Secondary | ICD-10-CM

## 2020-04-08 DIAGNOSIS — M25631 Stiffness of right wrist, not elsewhere classified: Secondary | ICD-10-CM | POA: Diagnosis not present

## 2020-04-08 DIAGNOSIS — M6281 Muscle weakness (generalized): Secondary | ICD-10-CM

## 2020-04-08 NOTE — Therapy (Signed)
[facility] [address] [place], [address] Phone: [phone]   Fax:  [phone]  Occupational Therapy Treatment/discharge  Patient Details  Name: Laura Ramos MRN: [id number] Date of Birth: 1932/08/28 Referring Provider (OT): Mikle Bosworth   Encounter Date: 04/08/2020   OT End of Session - 04/08/20 6168    Visit Number 10    Number of Visits 10    Date for OT Re-Evaluation 04/08/20    OT Start Time 3729    OT Stop Time 1334    OT Time Calculation (min) 29 min    Activity Tolerance Patient tolerated treatment well    Behavior During Therapy [facility] for tasks assessed/performed           Past Medical History:  Diagnosis Date  . Allergy   . Artery disease, cerebral   . Arthritis   . Cancer ([place])    skin  . Hyperlipidemia   . Osteoporosis   . TIA (transient ischemic attack)    x2, last apprx 2007. no deficits  . Vertigo    none in several yrs  . Wears dentures    partial lower    Past Surgical History:  Procedure Laterality Date  . CATARACT EXTRACTION W/ INTRAOCULAR LENS  IMPLANT, BILATERAL    . COLONOSCOPY    . PTOSIS REPAIR Bilateral 04/10/2017   Procedure: BLEPHAROPTOSIS REPAIR RESECT EX;  Surgeon: Karle Starch, MD;  Location: [facility];  Service: Ophthalmology;  Laterality: Bilateral;  . TUBAL LIGATION      There were no vitals filed for this visit.   Subjective Assessment - 04/08/20 1340    Subjective  I can tell I am stronger doing more things without thinking that I injured my wrist- pool doing great -started to use more weights - and not as afraid for falling anymore    Pertinent History The patient suffered a fall on 12/19/2019, the patient was treated with a short arm cast. The patient is now close to 1 month status post initial injury, she has been in the short arm cast since her initial visit on 12/23/2019. The patient feels that she is doing well. She denies any  significant pain to the right wrist. She denies any numbness or tingling to the right upper extremity at today's visit. Denies any repeat trauma since she was last evaluated. Cast was removed on 01/16/2020 and put in wrist splint - refer to OT for hand therapy on 02/05/2020    Patient Stated Goals I want to be able to use my R hand to drive , paint again , open jars , packages , pick up and hold objects    Currently in Pain? Yes    Pain Score 1     Pain Location Wrist    Pain Orientation Right    Pain Descriptors / Indicators --   slight pull -carrying weight more than 6 lbs             [facility] OT Assessment - 04/08/20 0001      AROM   Right Wrist Extension 65 Degrees    Right Wrist Flexion 70 Degrees    Right Wrist Radial Deviation 20 Degrees    Right Wrist Ulnar Deviation 30 Degrees    Left Wrist Extension 70 Degrees    Left Wrist Flexion 80 Degrees    Left Wrist Radial Deviation 20 Degrees    Left Wrist Ulnar Deviation 30 Degrees  Strength   Right Hand Grip (lbs) 26    Right Hand Lateral Pinch 7 lbs    Right Hand 3 Point Pinch 8 lbs    Left Hand Grip (lbs) 36    Left Hand Lateral Pinch 10 lbs    Left Hand 3 Point Pinch 9 lbs            measurement taken - see ROM and strength  grip increase - prehension same- has thumb CMC arthritis  Pt to cont with wrist flexion and extention stretches  Putty firm teal for gripping and provided more resistance - if getting easier  Able to push and pull heavy door open and close  Carry and lift 6 and 7 lbs on and off table  Using hand normally -  Do pool at gym 2 x wk and started weight  Pt discharge from OT services                 OT Education - 04/08/20 1342    Education Details discharge instructions    Person(s) Educated Patient    Methods Explanation;Demonstration;Tactile cues;Verbal cues;Handout    Comprehension Verbal cues required;Returned demonstration;Verbalized understanding            OT Short  Term Goals - 04/08/20 1346      OT SHORT TERM GOAL #1   Title Pt to be independent in HEP to increase R wrist AROM and fisting to touch palm without increase symptoms    Status Achieved      OT SHORT TERM GOAL #2   Title R hand flexion increase for pt to touch palm to hold utencils, brush, toiletpaper    Status Achieved             OT Long Term Goals - 04/08/20 1346      OT LONG TERM GOAL #1   Title R wrist AROM increase to [facility] for pt to use dominant hand in more than 75% of ADL's and IADLs    Status Achieved      OT LONG TERM GOAL #2   Title R grip and prehension strength increase to more than 50% compare to L hand to change gear of car, turn doorknob , carry more than 4 lbs, without increase symptoms    Baseline grip R 26, L 36 lbs can carry 6 and 7 lbs without pain - and lift on/off table    Status Achieved                 Plan - 04/08/20 1342    Clinical Impression Statement Pt is about 15 wks out from R distal radius fx - pt cont to show increase strength and functional use of R dominant hand in ADL's and IADL's - pt doing pushups - doing weight in pool 2 x wk -  can carry and lift about 7 lbs without pain - was able to maintain her wrist AROM - but can cont to do stretches for flexion and extention of wrist - strength increase- pt did had previous injury to R wrist causing some volar discomfort at time - grip increased - pt to contwith putty HEP  but not prehension- pt has some R thumb CMC arthritis- pt discharge at this time - met all goals    OT Occupational Profile and History Problem Focused Assessment - Including review of records relating to presenting problem    Occupational performance deficits (Please refer to evaluation for details): ADL's;IADL's;Play;Leisure;Social Participation    Body  Structure / Function / Physical Skills ADL;Edema;Flexibility;IADL;Pain;UE functional use;ROM;Strength    Rehab Potential Good    Clinical Decision Making Limited treatment  options, no task modification necessary    Comorbidities Affecting Occupational Performance: May have comorbidities impacting occupational performance    Modification or Assistance to Complete Evaluation  No modification of tasks or assist necessary to complete eval    Consulted and Agree with Plan of Care Patient           Patient will benefit from skilled therapeutic intervention in order to improve the following deficits and impairments:   Body Structure / Function / Physical Skills: ADL,Edema,Flexibility,IADL,Pain,UE functional use,ROM,Strength       Visit Diagnosis: Stiffness of right hand, not elsewhere classified  Muscle weakness (generalized)  Pain in right hand  Pain in right wrist  Stiffness of right wrist, not elsewhere classified    Problem List Patient Active Problem List   Diagnosis Date Noted  . Mixed hyperlipidemia 11/06/2014  . Osteoporosis 11/06/2014  . Allergic rhinitis 09/23/2013  . Cerebrovascular disease 09/23/2013    Rosalyn Gess OTR/L,CLT 04/08/2020, 1:47 PM  [facility] [address] [place], [address] Phone: [phone]   Fax:  [phone]  Name: ASHLEYMARIE GRANDERSON MRN: [id number] Date of Birth: [birth year]

## 2020-04-26 DIAGNOSIS — R059 Cough, unspecified: Secondary | ICD-10-CM | POA: Diagnosis not present

## 2020-05-28 DIAGNOSIS — E7800 Pure hypercholesterolemia, unspecified: Secondary | ICD-10-CM | POA: Diagnosis not present

## 2020-05-28 DIAGNOSIS — Z79899 Other long term (current) drug therapy: Secondary | ICD-10-CM | POA: Diagnosis not present

## 2020-06-04 DIAGNOSIS — Z1331 Encounter for screening for depression: Secondary | ICD-10-CM | POA: Diagnosis not present

## 2020-06-04 DIAGNOSIS — Z79899 Other long term (current) drug therapy: Secondary | ICD-10-CM | POA: Diagnosis not present

## 2020-06-04 DIAGNOSIS — Z0000 Encounter for general adult medical examination without abnormal findings: Secondary | ICD-10-CM | POA: Diagnosis not present

## 2020-06-04 DIAGNOSIS — E7800 Pure hypercholesterolemia, unspecified: Secondary | ICD-10-CM | POA: Diagnosis not present

## 2020-06-10 DIAGNOSIS — D0439 Carcinoma in situ of skin of other parts of face: Secondary | ICD-10-CM | POA: Diagnosis not present

## 2020-06-10 DIAGNOSIS — L821 Other seborrheic keratosis: Secondary | ICD-10-CM | POA: Diagnosis not present

## 2020-07-27 DIAGNOSIS — H40003 Preglaucoma, unspecified, bilateral: Secondary | ICD-10-CM | POA: Diagnosis not present

## 2020-08-13 DIAGNOSIS — R300 Dysuria: Secondary | ICD-10-CM | POA: Diagnosis not present

## 2020-08-13 DIAGNOSIS — R350 Frequency of micturition: Secondary | ICD-10-CM | POA: Diagnosis not present

## 2020-09-22 DIAGNOSIS — N390 Urinary tract infection, site not specified: Secondary | ICD-10-CM | POA: Diagnosis not present

## 2020-09-22 DIAGNOSIS — J029 Acute pharyngitis, unspecified: Secondary | ICD-10-CM | POA: Diagnosis not present

## 2020-09-22 DIAGNOSIS — Z03818 Encounter for observation for suspected exposure to other biological agents ruled out: Secondary | ICD-10-CM | POA: Diagnosis not present

## 2020-09-22 DIAGNOSIS — R0981 Nasal congestion: Secondary | ICD-10-CM | POA: Diagnosis not present

## 2020-09-22 DIAGNOSIS — R300 Dysuria: Secondary | ICD-10-CM | POA: Diagnosis not present

## 2020-11-24 DIAGNOSIS — E7800 Pure hypercholesterolemia, unspecified: Secondary | ICD-10-CM | POA: Diagnosis not present

## 2020-11-24 DIAGNOSIS — Z79899 Other long term (current) drug therapy: Secondary | ICD-10-CM | POA: Diagnosis not present

## 2020-11-30 DIAGNOSIS — E7800 Pure hypercholesterolemia, unspecified: Secondary | ICD-10-CM | POA: Diagnosis not present

## 2020-11-30 DIAGNOSIS — N1831 Chronic kidney disease, stage 3a: Secondary | ICD-10-CM | POA: Diagnosis not present

## 2020-11-30 DIAGNOSIS — I679 Cerebrovascular disease, unspecified: Secondary | ICD-10-CM | POA: Diagnosis not present

## 2020-11-30 DIAGNOSIS — Z79899 Other long term (current) drug therapy: Secondary | ICD-10-CM | POA: Diagnosis not present

## 2020-12-10 DIAGNOSIS — L298 Other pruritus: Secondary | ICD-10-CM | POA: Diagnosis not present

## 2020-12-15 DIAGNOSIS — L298 Other pruritus: Secondary | ICD-10-CM | POA: Diagnosis not present

## 2020-12-15 DIAGNOSIS — L309 Dermatitis, unspecified: Secondary | ICD-10-CM | POA: Diagnosis not present

## 2020-12-15 DIAGNOSIS — L538 Other specified erythematous conditions: Secondary | ICD-10-CM | POA: Diagnosis not present

## 2020-12-15 DIAGNOSIS — Z08 Encounter for follow-up examination after completed treatment for malignant neoplasm: Secondary | ICD-10-CM | POA: Diagnosis not present

## 2020-12-15 DIAGNOSIS — L820 Inflamed seborrheic keratosis: Secondary | ICD-10-CM | POA: Diagnosis not present

## 2020-12-15 DIAGNOSIS — D225 Melanocytic nevi of trunk: Secondary | ICD-10-CM | POA: Diagnosis not present

## 2020-12-15 DIAGNOSIS — L821 Other seborrheic keratosis: Secondary | ICD-10-CM | POA: Diagnosis not present

## 2020-12-15 DIAGNOSIS — Z85828 Personal history of other malignant neoplasm of skin: Secondary | ICD-10-CM | POA: Diagnosis not present

## 2021-01-04 DIAGNOSIS — Z136 Encounter for screening for cardiovascular disorders: Secondary | ICD-10-CM | POA: Diagnosis not present

## 2021-01-04 DIAGNOSIS — N390 Urinary tract infection, site not specified: Secondary | ICD-10-CM | POA: Diagnosis not present

## 2021-01-10 DIAGNOSIS — R350 Frequency of micturition: Secondary | ICD-10-CM | POA: Diagnosis not present

## 2021-01-10 DIAGNOSIS — Z03818 Encounter for observation for suspected exposure to other biological agents ruled out: Secondary | ICD-10-CM | POA: Diagnosis not present

## 2021-01-10 DIAGNOSIS — R051 Acute cough: Secondary | ICD-10-CM | POA: Diagnosis not present

## 2021-01-20 DIAGNOSIS — R300 Dysuria: Secondary | ICD-10-CM | POA: Diagnosis not present

## 2021-01-20 DIAGNOSIS — N390 Urinary tract infection, site not specified: Secondary | ICD-10-CM | POA: Diagnosis not present

## 2021-01-20 DIAGNOSIS — R8290 Unspecified abnormal findings in urine: Secondary | ICD-10-CM | POA: Diagnosis not present

## 2021-01-25 DIAGNOSIS — H40003 Preglaucoma, unspecified, bilateral: Secondary | ICD-10-CM | POA: Diagnosis not present

## 2021-02-01 DIAGNOSIS — H40003 Preglaucoma, unspecified, bilateral: Secondary | ICD-10-CM | POA: Diagnosis not present

## 2021-04-04 DIAGNOSIS — M25511 Pain in right shoulder: Secondary | ICD-10-CM | POA: Diagnosis not present

## 2021-04-04 DIAGNOSIS — M7541 Impingement syndrome of right shoulder: Secondary | ICD-10-CM | POA: Diagnosis not present

## 2021-04-05 ENCOUNTER — Ambulatory Visit: Payer: PPO | Attending: Orthopedic Surgery | Admitting: Physical Therapy

## 2021-04-05 ENCOUNTER — Encounter: Payer: Self-pay | Admitting: Physical Therapy

## 2021-04-05 DIAGNOSIS — M6281 Muscle weakness (generalized): Secondary | ICD-10-CM | POA: Diagnosis not present

## 2021-04-05 DIAGNOSIS — M25611 Stiffness of right shoulder, not elsewhere classified: Secondary | ICD-10-CM | POA: Diagnosis not present

## 2021-04-05 DIAGNOSIS — M25511 Pain in right shoulder: Secondary | ICD-10-CM | POA: Diagnosis not present

## 2021-04-05 NOTE — Therapy (Signed)
[facility] [address] [place], [address] Phone: [phone]   Fax:  [phone]  Physical Therapy Evaluation  Patient Details  Name: Laura Ramos MRN: [id number] Date of Birth: 30-May-1932 Referring Provider (PT): [place] PA  Encounter Date: 04/05/2021   PT End of Session - 04/05/21 1527     Visit Number 1    Number of Visits 17    Date for PT Re-Evaluation 06/03/21    Authorization Type [employer]    Authorization - Visit Number 1    Progress Note Due on Visit 10    PT Start Time 1430    PT Stop Time 1520    PT Time Calculation (min) 50 min    Activity Tolerance Patient tolerated treatment well;No increased pain    Behavior During Therapy [facility] for tasks assessed/performed             [facility] PT Assessment - 04/06/21 0001       Assessment   Medical Diagnosis R shoulder pain    Referring Provider (PT) Babish PA    Onset Date/Surgical Date 01/24/21    Hand Dominance Right    Next MD Visit none    Prior Therapy yes      Balance Screen   Has the patient fallen in the past 6 months No    Has the patient had a decrease in activity level because of a fear of falling?  No    Is the patient reluctant to leave their home because of a fear of falling?  No             Past Medical History:  Diagnosis Date   Allergy    Artery disease, cerebral    Arthritis    Cancer ([place])    skin   Hyperlipidemia    Osteoporosis    TIA (transient ischemic attack)    x2, last apprx 2007. no deficits   Vertigo    none in several yrs   Wears dentures    partial lower    Past Surgical History:  Procedure Laterality Date   CATARACT EXTRACTION W/ INTRAOCULAR LENS  IMPLANT, BILATERAL     COLONOSCOPY     PTOSIS REPAIR Bilateral 04/10/2017   Procedure: BLEPHAROPTOSIS REPAIR RESECT EX;  Surgeon: Karle Starch, MD;  Location: [facility];  Service: Ophthalmology;  Laterality: Bilateral;    TUBAL LIGATION      There were no vitals filed for this visit.       OBJECTIVE  MUSCULOSKELETAL: Tremor: Normal Bulk: Normal Tone: Normal  Cervical Screen AROM: WFL and painless with overpressure in all planes Spurlings A (ipsilateral lateral flexion/axial compression): R: Negative L: Negative Spurlings B (ipsilateral lateral flexion/contralateral rotation/axial compression): R: Negative L: Negative Repeated movement: No centralization or peripheralization with protraction or retraction   Elbow Screen Elbow AROM: Within Normal Limits bilat with pain at R shoulder with R elbow ext  Palpation No pain with palpation to anterior, posterior, lateral, and superior shoulder; reports usual pain is at palpation of R UT and ant and middle deltoid. Latent tension at R pec minor and tension with trigger points R UT  Posture/Observation Posture forward head rounded shoulders Scapulohumeral rhythm of LUE with overhead mobility with only mild scapular dyskinesis with minimal hiking with scaption  Strength R/L 2-/4+Shoulder flexion (anterior deltoid/pec major/coracobrachialis, axillary n. (C5-6) and musculocutaneous n. (C5-7)) 2-/5 Shoulder abduction (deltoid/supraspinatus, axillary/suprascapular n, C5) 2/4+ Shoulder external rotation (infraspinatus/teres minor)  2/5 Shoulder internal rotation (subcapularis/lats/pec major) 2/5 Shoulder extension (posterior deltoid, lats, teres major, axillary/thoracodorsal n.) 4+/5 Elbow flexion (biceps brachii, brachialis, brachioradialis, musculoskeletal n, C5-6) 4+/5 Elbow extension (triceps, radial n, C7) 5/5 Wrist Extension 5/5 Wrist Flexion 5/5 Finger adduction (interossei, ulnar n, T1)  AROM R/L 19d/180 Shoulder flexion 31/180 Shoulder abduction R ear/CTJ Shoulder external rotation L1T12 Shoulder internal rotation 47/60 Shoulder extension Scapular mobility WNL bilat *Indicates pain, overpressure performed unless otherwise  indicated  PROM R/L 169/180 Shoulder flexion 139/180 Shoulder abduction 84/90 Shoulder external rotation 69/70 Shoulder internal rotation 60/60 Shoulder extension *Indicates pain, overpressure performed unless otherwise indicated  Accessory Motions/Glides Glenohumeral: Posterior: R: normal L: normal Inferior: R: normal L: normal Anterior: R: normal L: normal Mobility WNL with guarding d/t pain throughout  Acromioclavicular:  Posterior: R: normal L: normal Anterior: R: normal L: normal  Sternoclavicular: Posterior: R: normal L: normal Anterior: R: normal L: normal Superior: R: normal L: normal Inferior: R: normal L: normal   Muscle Length Testing Pectoralis Major: R: normal L: normal Pectoralis Minor: WNL but with tension and guarding Biceps: R: normal L: normal  NEUROLOGICAL:  Mental Status Patient is oriented to person, place and time.  Recent memory is intact.  Remote memory is intact.  Attention span and concentration are intact.  Expressive speech is intact.  Patient's fund of knowledge is within normal limits for educational level.  Sensation Grossly intact to light touch bilateral UE as determined by testing dermatomes C2-T2 Proprioception and hot/cold testing deferred on this date   SPECIAL TESTS  Rotator Cuff  Drop Arm Test: Positive Painful Arc (Pain from 60 to 120 degrees scaption): Positive Infraspinatus Muscle Test: Positive True positivity of cuff tear specifically limited d/t pain with all movements of shoulder, unlikely d/t subjective report of pain onset and type  Subacromial Impingement Hawkins-Kennedy: Positive Neer (Block scapula, PROM flexion): Positive Painful Arc (Pain from 60 to 120 degrees scaption): Positive Empty Can: Not done External Rotation Resistance: Positive Horizontal Adduction: Not done True positivity of impingement testing limited d/t pain with all movements of shoulder, probable for impingement d/t OA  however  Labral Tear Biceps Load II (120 elevation, full ER, 90 elbow flexion, full supination, resisted elbow flexion): Not done Crank (160 scaption, axial load with IR/ER): Negative Active Compression Test: Negative  Bicep Tendon Pathology Speed (shoulder flexion to 90, external rotation, full elbow extension, and forearm supination with resistance: Not examined Yergason's (resisted shoulder ER and supination/biceps tendon pathology): Not examined  Shoulder Instability Sulcus Sign: Negative Anterior Apprehension: Negative  Beighton scale  LEFT  RIGHT           1. Passive dorsiflexion and hyperextension of the fifth MCP joint beyond 90  0 0   2. Passive apposition of the thumb to the flexor aspect of the forearm  0  0   3. Passive hyperextension of the elbow beyond 10  0  0   4. Passive hyperextension of the knee beyond 10  0  0   5. Active forward flexion of the trunk with the knees fully extended so that the palms of the hands rest flat on the floor   0   TOTAL         0/ 9     Ther-Ex PT reviewed the following HEP with patient with patient able to demonstrate a set of the following with min cuing for correction needed. PT educated patient on parameters of therex (how/when to inc/decrease intensity, frequency, rep/set range, stretch hold  time, and purpose of therex) with verbalized understanding.  Access Code: [email or web address] Seated Shoulder Flexion AAROM with Pulley Behind - 3 x daily - 7 x weekly - 12 reps - 2sec hold Seated Shoulder Abduction AAROM with Pulley Behind - 3 x daily - 7 x weekly - 12 reps - 2sec hold       [facility] PT Assessment - 04/06/21 0001       Assessment   Medical Diagnosis R shoulder pain    Referring Provider (PT) Babish PA    Onset Date/Surgical Date 01/24/21    Hand Dominance Right    Next MD Visit none    Prior Therapy yes      Balance Screen   Has the patient fallen in the past 6 months No    Has the patient had a decrease in activity level  because of a fear of falling?  No    Is the patient reluctant to leave their home because of a fear of falling?  No                        Objective measurements completed on examination: See above findings.                PT Education - 04/05/21 1448     Education Details Patient was educated on diagnosis, anatomy and pathology involved, prognosis, role of PT, and was given an HEP, demonstrating exercise with proper form following verbal and tactile cues, and was given a paper hand out to continue exercise at home. Pt was educated on and agreed to plan of care.    Person(s) Educated Patient    Methods Explanation;Demonstration;Verbal cues    Comprehension Verbal cues required;Returned demonstration;Verbalized understanding              PT Short Term Goals - 04/06/21 1409       PT SHORT TERM GOAL #1   Title Pt will be independent with HEP in order to improve strength and decrease pain in order to improve pain-free function at home and work.    Baseline 04/06/21 HEP given    Time 4    Period Weeks    Status New      PT SHORT TERM GOAL #2   Title Pt will demonstrate full passive R shoulder ROM in order to demonstrate increased joint mobility needed for AROM progression    Baseline 04/06/21 flex: 169 abd 139d ER 84d    Time 6    Period Weeks    Status New               PT Long Term Goals - 04/06/21 1409       PT LONG TERM GOAL #1   Title Patient will increase FOTO score to 54 to demonstrate predicted increase in functional mobility to complete ADLs    Baseline 04/06/21 28    Time 8    Period Weeks    Status New      PT LONG TERM GOAL #2   Title Pt will decrease worst pain as reported on NPRS by at least 3 points in order to demonstrate clinically significant reduction in pain.    Baseline 04/06/21 10/10    Time 8    Period Weeks    Status New      PT LONG TERM GOAL #3   Title Pt will demonstrate full active R shoulder ROM in order  to complete self care ADLs  Baseline 04/06/21 flex: 19d abd 31d ER: R ear IR L1    Time 8    Period Weeks    Status New      PT LONG TERM GOAL #4   Title Patient will demonstrate gross shoulder and periscapular strengh of at least 45 in order to complete household ADLs    Baseline 04/06/21 gross 2/5    Time 8    Period Weeks    Status New                    Plan - 04/06/21 1401     Clinical Impression Statement Pt is an 87 year old female presenting with R shoulder pain and decreased motion with insideous onset. Differential diagnosis limited due to pain with all movements of the shoulder, though adhesive capsulitis and RTC tear cannot be ruled out, signs and symptoms of definite subacromial impingement present. Impairments in decreased R shoulder AROM/AAROM/PROM, decreased strength of R RTC, decreased strength of bilat periscapular muscles, abnormal posture, decreased motor control, and pain. Activity limitations in reaching, carrying, lifting, sleeping, pushing and pulling; inhibiting full participation in all ADLs and community activity.    Personal Factors and Comorbidities Age;Fitness;Comorbidity 2;Time since onset of injury/illness/exacerbation;Past/Current Experience    Comorbidities osteoporosis, HLD    Examination-Activity Limitations Bathing;Locomotion Level;Transfers;Squat;Stairs;Toileting    Examination-Participation Restrictions Community Activity;Cleaning;Driving;Laundry;Meal Prep    Stability/Clinical Decision Making Evolving/Moderate complexity    Clinical Decision Making Moderate    Rehab Potential Good    PT Frequency 2x / week    PT Duration 8 weeks    PT Treatment/Interventions ADLs/Self Care Home Management;Gait training;Stair training;Functional mobility training;Therapeutic activities;Therapeutic exercise;Balance training;Neuromuscular re-education;Patient/family education;Electrical Stimulation;Cryotherapy;Iontophoresis 4mg /ml Dexamethasone;Moist  Heat;Traction;Ultrasound;Manual techniques;Dry needling;Joint Manipulations;Spinal Manipulations;Passive range of motion    PT Next Visit Plan HEP review, PROM and AAROM    PT Home Exercise Plan pulleys aarom flex/abd    Consulted and Agree with Plan of Care Patient             Patient will benefit from skilled therapeutic intervention in order to improve the following deficits and impairments:  Abnormal gait, Decreased activity tolerance, Decreased balance, Decreased mobility, Decreased range of motion, Difficulty walking, Increased muscle spasms, Decreased safety awareness, Decreased endurance, Decreased strength, Postural dysfunction, Improper body mechanics, Pain, Impaired UE functional use, Increased fascial restricitons, Hypomobility  Visit Diagnosis: Stiffness of right shoulder, not elsewhere classified  Acute pain of right shoulder     Problem List Patient Active Problem List   Diagnosis Date Noted   Mixed hyperlipidemia 11/06/2014   Osteoporosis 11/06/2014   Allergic rhinitis 09/23/2013   Cerebrovascular disease 09/23/2013    Durwin Reges, PT 04/06/2021, 2:15 PM  [facility] [address] [place], [address] Phone: [phone]   Fax:  [phone]  Name: Laura Ramos MRN: [id number] Date of Birth: 03/22/1933

## 2021-04-07 ENCOUNTER — Encounter: Payer: Self-pay | Admitting: Physical Therapy

## 2021-04-07 ENCOUNTER — Ambulatory Visit: Payer: PPO | Admitting: Physical Therapy

## 2021-04-07 DIAGNOSIS — M25611 Stiffness of right shoulder, not elsewhere classified: Secondary | ICD-10-CM | POA: Diagnosis not present

## 2021-04-07 DIAGNOSIS — M25511 Pain in right shoulder: Secondary | ICD-10-CM

## 2021-04-07 NOTE — Therapy (Signed)
[facility] [address], [place], [address] Phone: [phone]   Fax:  [phone]  Physical Therapy Treatment  Patient Details  Name: Laura Ramos MRN: [id number] Date of Birth: 08-Apr-1933 Referring Provider (PT): [place] PA   Encounter Date: 04/07/2021   PT End of Session - 04/07/21 1518     Visit Number 2    Number of Visits 17    Date for PT Re-Evaluation 06/03/21    Authorization Type [employer]    Authorization Time Period 01/21/20-03/17/20    Authorization - Visit Number 2    Progress Note Due on Visit 10    PT Start Time 6387    PT Stop Time 1513    PT Time Calculation (min) 41 min    Activity Tolerance Patient tolerated treatment well;No increased pain    Behavior During Therapy WFL for tasks assessed/performed             Past Medical History:  Diagnosis Date   Allergy    Artery disease, cerebral    Arthritis    Cancer ([place])    skin   Hyperlipidemia    Osteoporosis    TIA (transient ischemic attack)    x2, last apprx 2007. no deficits   Vertigo    none in several yrs   Wears dentures    partial lower    Past Surgical History:  Procedure Laterality Date   CATARACT EXTRACTION W/ INTRAOCULAR LENS  IMPLANT, BILATERAL     COLONOSCOPY     PTOSIS REPAIR Bilateral 04/10/2017   Procedure: BLEPHAROPTOSIS REPAIR RESECT EX;  Surgeon: Karle Starch, MD;  Location: [facility];  Service: Ophthalmology;  Laterality: Bilateral;   TUBAL LIGATION      There were no vitals filed for this visit.   Subjective Assessment - 04/07/21 1438     Subjective Patient reports she is feeling better today since evaluation because her injection is kicking in. She is completing her HEP, though it took her a while to get her pulleys set up. She reports 5/10 pain today    Pertinent History Pt is an 87 year old female presenting with R shoulder pain that has come on insideously over a  couple months. She started noticing pain occuring with overhead reaching completing household ADLs and with overhead lifting in her water aerobics (2x/week). She was seen at [facility] after some time, and had an injection and Xray yesterday at [facility]. Reports her pain has worsened to the point now that she has pain with any and all reaching and lifting, driving (especially starting her car and turning), sleeping on her R side. Current pain level 5/10;worst 10/10; best 5/10. Reports her pain is diffuse over the whole shoulder when it comes on and if feels sharp, and reports the shot may have dulled her pain somewhat. No numbness or tingling in the UE or the hands. Denies neck or scapula. Pt is R handed. Lives at home alone, is completing her own ADLs but reports they are very difficult, she is doing more takeout than cooking due to pain, and is taking sink baths. Pt is retired, but enjoys socializing with friends, [place], and her water aerobics class. Pt denies N/V, B&B changes, unexplained weight fluctuation, saddle paresthesia, fever, night sweats, or unrelenting night pain at this time.    Limitations Lifting;House hold activities;Reading;Writing    How long can you sit comfortably? N/A    How long can you stand  comfortably? N/A    How long can you walk comfortably? N/A    Diagnostic tests Xray 04/04/21 pt reports "there is some narrowing in her shoulder on xray, no fractures"    Patient Stated Goals do what I need to do without pain    Pain Onset 1 to 4 weeks ago                Ther-Ex AAROM Pulleys flex and abd x15 each 2sec hold  Attempted UE ranger at very small range, patient ultimately unable to complete Attempted wall walks as well with increased pain after about 10 reps to approx 90d Supine aarom with dowel overhead flex x12 2-3sec hold- 151d at last repetition Supine bilat scapula retraction <> protraction with dowel 2x 10 with max cuing for proper technique without elbow  flexion with good carry over   PT reviewed the following HEP with patient with patient able to demonstrate a set of the following with min cuing for correction needed. PT educated patient on parameters of therex (how/when to inc/decrease intensity, frequency, rep/set range, stretch hold time, and purpose of therex) with verbalized understanding.   Standing Isometric Shoulder Flexion with Doorway - Arm Bent - 7 x weekly - 3-5 reps - 5-10sec hold Standing Isometric Shoulder Internal Rotation with Towel Roll at Doorway - 7 x weekly - 3-5 reps - 5-10sec hold Standing Isometric Shoulder External Rotation with Doorway - 7 x weekly - 3-5 reps - 5-10sec hold Standing Isometric Shoulder Abduction with Doorway - Arm Bent - 7 x weekly - 3-5 reps - 5-10sec hold   Manual STM with trigger point release to R UT G3 mobilization with flex and ext motions with near full ROM following Near full PROM of ER and IR                       PT Education - 04/07/21 1518     Education Details therex form/technique, HEP update    Person(s) Educated Patient    Methods Explanation;Demonstration;Verbal cues    Comprehension Verbalized understanding;Returned demonstration;Verbal cues required              PT Short Term Goals - 04/06/21 1409       PT SHORT TERM GOAL #1   Title Pt will be independent with HEP in order to improve strength and decrease pain in order to improve pain-free function at home and work.    Baseline 04/06/21 HEP given    Time 4    Period Weeks    Status New      PT SHORT TERM GOAL #2   Title Pt will demonstrate full passive R shoulder ROM in order to demonstrate increased joint mobility needed for AROM progression    Baseline 04/06/21 flex: 169 abd 139d ER 84d    Time 6    Period Weeks    Status New               PT Long Term Goals - 04/06/21 1409       PT LONG TERM GOAL #1   Title Patient will increase FOTO score to 54 to demonstrate predicted  increase in functional mobility to complete ADLs    Baseline 04/06/21 28    Time 8    Period Weeks    Status New      PT LONG TERM GOAL #2   Title Pt will decrease worst pain as reported on NPRS by at least 3 points in  order to demonstrate clinically significant reduction in pain.    Baseline 04/06/21 10/10    Time 8    Period Weeks    Status New      PT LONG TERM GOAL #3   Title Pt will demonstrate full active R shoulder ROM in order to complete self care ADLs    Baseline 04/06/21 flex: 19d abd 31d ER: R ear IR L1    Time 8    Period Weeks    Status New      PT LONG TERM GOAL #4   Title Patient will demonstrate gross shoulder and periscapular strengh of at least 45 in order to complete household ADLs    Baseline 04/06/21 gross 2/5    Time 8    Period Weeks    Status New                   Plan - 04/07/21 1519     Clinical Impression Statement PT initiated therex progression for increased shoulder mobility and low level RTC and scapular strengthening/muscle activation with success. Pain limiting less restrictrictive AAROM than pulleys. Pt is able to obtain increased PROM following manual techniques. PT initiated low level strengthening of periscapular and RTC musculature, with pt able to complete but reporting some pain with isometrics. PT attempted to modify isometric exercises within reduced pain confines, for HEP, will follow up about success at home. PT will continue progression as able.    Personal Factors and Comorbidities Age;Fitness;Comorbidity 2;Time since onset of injury/illness/exacerbation;Past/Current Experience    Comorbidities osteoporosis, HLD    Examination-Activity Limitations Bathing;Locomotion Level;Transfers;Squat;Stairs;Toileting    Examination-Participation Restrictions Community Activity;Cleaning;Driving;Laundry;Meal Prep    Stability/Clinical Decision Making Evolving/Moderate complexity    Clinical Decision Making Moderate    Rehab Potential Good     PT Frequency 2x / week    PT Duration 8 weeks    PT Treatment/Interventions ADLs/Self Care Home Management;Gait training;Stair training;Functional mobility training;Therapeutic activities;Therapeutic exercise;Balance training;Neuromuscular re-education;Patient/family education;Electrical Stimulation;Cryotherapy;Iontophoresis 4mg /ml Dexamethasone;Moist Heat;Traction;Ultrasound;Manual techniques;Dry needling;Joint Manipulations;Spinal Manipulations;Passive range of motion    PT Next Visit Plan HEP review    PT Home Exercise Plan pulleys aarom flex/abd    Consulted and Agree with Plan of Care Patient             Patient will benefit from skilled therapeutic intervention in order to improve the following deficits and impairments:  Abnormal gait, Decreased activity tolerance, Decreased balance, Decreased mobility, Decreased range of motion, Difficulty walking, Increased muscle spasms, Decreased safety awareness, Decreased endurance, Decreased strength, Postural dysfunction, Improper body mechanics, Pain, Impaired UE functional use, Increased fascial restricitons, Hypomobility  Visit Diagnosis: Acute pain of right shoulder     Problem List Patient Active Problem List   Diagnosis Date Noted   Mixed hyperlipidemia 11/06/2014   Osteoporosis 11/06/2014   Allergic rhinitis 09/23/2013   Cerebrovascular disease 09/23/2013   Durwin Reges DPT Durwin Reges, PT 04/07/2021, 4:05 PM  [facility] [address], [place], [address] Phone: [phone]   Fax:  [phone]  Name: Laura Ramos MRN: [id number] Date of Birth: 21-Sep-1932

## 2021-04-12 ENCOUNTER — Ambulatory Visit: Payer: PPO | Admitting: Physical Therapy

## 2021-04-12 ENCOUNTER — Encounter: Payer: Self-pay | Admitting: Physical Therapy

## 2021-04-12 DIAGNOSIS — M25611 Stiffness of right shoulder, not elsewhere classified: Secondary | ICD-10-CM | POA: Diagnosis not present

## 2021-04-12 DIAGNOSIS — M25511 Pain in right shoulder: Secondary | ICD-10-CM

## 2021-04-12 NOTE — Therapy (Signed)
[facility] [address], [place], [address] Phone: [phone]   Fax:  [phone]  Physical Therapy Treatment  Patient Details  Name: Laura Ramos MRN: [id number] Date of Birth: 03-05-1933 Referring Provider (PT): [place] PA   Encounter Date: 04/12/2021   PT End of Session - 04/12/21 1440     Visit Number 3    Number of Visits 17    Date for PT Re-Evaluation 06/03/21    Authorization - Visit Number 3    Progress Note Due on Visit 10    PT Start Time 7253    PT Stop Time 1513    PT Time Calculation (min) 40 min    Activity Tolerance Patient tolerated treatment well;No increased pain    Behavior During Therapy WFL for tasks assessed/performed             Past Medical History:  Diagnosis Date   Allergy    Artery disease, cerebral    Arthritis    Cancer ([place])    skin   Hyperlipidemia    Osteoporosis    TIA (transient ischemic attack)    x2, last apprx 2007. no deficits   Vertigo    none in several yrs   Wears dentures    partial lower    Past Surgical History:  Procedure Laterality Date   CATARACT EXTRACTION W/ INTRAOCULAR LENS  IMPLANT, BILATERAL     COLONOSCOPY     PTOSIS REPAIR Bilateral 04/10/2017   Procedure: BLEPHAROPTOSIS REPAIR RESECT EX;  Surgeon: Karle Starch, MD;  Location: [facility];  Service: Ophthalmology;  Laterality: Bilateral;   TUBAL LIGATION      There were no vitals filed for this visit.   Subjective Assessment - 04/12/21 1437     Subjective Pt reports she is doing better today, no pain currently, though she does still have pain with movement. She has been able to do her hair which is an improvement. Has been using her pulleys, but is having trouble with her HEP    Pertinent History Pt is an 87 year old female presenting with R shoulder pain that has come on insideously over a couple months. She started noticing pain occuring with overhead reaching  completing household ADLs and with overhead lifting in her water aerobics (2x/week). She was seen at [facility] after some time, and had an injection and Xray yesterday at [facility]. Reports her pain has worsened to the point now that she has pain with any and all reaching and lifting, driving (especially starting her car and turning), sleeping on her R side. Current pain level 5/10;worst 10/10; best 5/10. Reports her pain is diffuse over the whole shoulder when it comes on and if feels sharp, and reports the shot may have dulled her pain somewhat. No numbness or tingling in the UE or the hands. Denies neck or scapula. Pt is R handed. Lives at home alone, is completing her own ADLs but reports they are very difficult, she is doing more takeout than cooking due to pain, and is taking sink baths. Pt is retired, but enjoys socializing with friends, [place], and her water aerobics class. Pt denies N/V, B&B changes, unexplained weight fluctuation, saddle paresthesia, fever, night sweats, or unrelenting night pain at this time.    Limitations Lifting;House hold activities;Reading;Writing    How long can you sit comfortably? N/A    How long can you stand comfortably? N/A    How long can you  walk comfortably? N/A    Diagnostic tests Xray 04/04/21 pt reports "there is some narrowing in her shoulder on xray, no fractures"    Patient Stated Goals do what I need to do without pain    Pain Onset 1 to 4 weeks ago            Ther-Ex AAROM Pulleys flex and abd x15 each 2sec hold  UE ranger x6 clockwise/counterclockwise with min cuing for max range Finger walks up wall with foot walk forward at end range x6 2sec hold Towel IR x12 2sec hold with good carry over of demo Seated scaption 3x 8 with cuing for scapulohumeral rhythm with good carry over Seated bilat ER attempted with RTB x8 reps, difficult to obtain full motion available  - with YTB x10 with good carry over of cuing for scapular motion  Review  of 4 isometric motions for HEP with wall corner to make more feasible in home environment with good carry over of cuing- pt aware that these exercises will likely be d/c'd soon if her motion continues to show such improvement                                  PT Education - 04/12/21 1439     Education Details therex form/technique, HEP review    Person(s) Educated Patient    Methods Explanation;Demonstration;Verbal cues    Comprehension Verbalized understanding;Returned demonstration;Verbal cues required              PT Short Term Goals - 04/06/21 1409       PT SHORT TERM GOAL #1   Title Pt will be independent with HEP in order to improve strength and decrease pain in order to improve pain-free function at home and work.    Baseline 04/06/21 HEP given    Time 4    Period Weeks    Status New      PT SHORT TERM GOAL #2   Title Pt will demonstrate full passive R shoulder ROM in order to demonstrate increased joint mobility needed for AROM progression    Baseline 04/06/21 flex: 169 abd 139d ER 84d    Time 6    Period Weeks    Status New               PT Long Term Goals - 04/06/21 1409       PT LONG TERM GOAL #1   Title Patient will increase FOTO score to 54 to demonstrate predicted increase in functional mobility to complete ADLs    Baseline 04/06/21 28    Time 8    Period Weeks    Status New      PT LONG TERM GOAL #2   Title Pt will decrease worst pain as reported on NPRS by at least 3 points in order to demonstrate clinically significant reduction in pain.    Baseline 04/06/21 10/10    Time 8    Period Weeks    Status New      PT LONG TERM GOAL #3   Title Pt will demonstrate full active R shoulder ROM in order to complete self care ADLs    Baseline 04/06/21 flex: 19d abd 31d ER: R ear IR L1    Time 8    Period Weeks    Status New      PT LONG TERM GOAL #4   Title Patient will demonstrate gross shoulder  and periscapular strengh of  at least 47 in order to complete household ADLs    Baseline 04/06/21 gross 2/5    Time 8    Period Weeks    Status New                   Plan - 04/12/21 1515     Clinical Impression Statement Pt with remarkable increase in AAROM and AROM ROM today, with near full behind head IR/ER and overhead flex, approx 40d lag in abd. Pt reports following manual techniques she was sore, then noticed she had more motion and had noticed an increase since. PT is able to progress therex with increased AROM and resistance focus, with cuing for scapulohumeral rhythm for efficient movement patterns with good carry over from patient. Paitent is motivated throughout session with no increased pain throughout session. PT will continue therex progression as able.    Personal Factors and Comorbidities Age;Fitness;Comorbidity 2;Time since onset of injury/illness/exacerbation;Past/Current Experience    Comorbidities osteoporosis, HLD    Examination-Activity Limitations Bathing;Locomotion Level;Transfers;Squat;Stairs;Toileting    Examination-Participation Restrictions Community Activity;Cleaning;Driving;Laundry;Meal Prep    Stability/Clinical Decision Making Evolving/Moderate complexity    Clinical Decision Making Moderate    Rehab Potential Good    PT Frequency 2x / week    PT Duration 8 weeks    PT Treatment/Interventions ADLs/Self Care Home Management;Gait training;Stair training;Functional mobility training;Therapeutic activities;Therapeutic exercise;Balance training;Neuromuscular re-education;Patient/family education;Electrical Stimulation;Cryotherapy;Iontophoresis 4mg /ml Dexamethasone;Moist Heat;Traction;Ultrasound;Manual techniques;Dry needling;Joint Manipulations;Spinal Manipulations;Passive range of motion    PT Next Visit Plan AROM and resistance focus as able; focus on scapulohumeral rhythm    PT Home Exercise Plan pulleys aarom flex/abd; isometrics    Consulted and Agree with Plan of Care Patient              Patient will benefit from skilled therapeutic intervention in order to improve the following deficits and impairments:  Abnormal gait, Decreased activity tolerance, Decreased balance, Decreased mobility, Decreased range of motion, Difficulty walking, Increased muscle spasms, Decreased safety awareness, Decreased endurance, Decreased strength, Postural dysfunction, Improper body mechanics, Pain, Impaired UE functional use, Increased fascial restricitons, Hypomobility  Visit Diagnosis: Acute pain of right shoulder  Stiffness of right shoulder, not elsewhere classified     Problem List Patient Active Problem List   Diagnosis Date Noted   Mixed hyperlipidemia 11/06/2014   Osteoporosis 11/06/2014   Allergic rhinitis 09/23/2013   Cerebrovascular disease 09/23/2013   Durwin Reges DPT Durwin Reges, PT 04/12/2021, 3:21 PM  [facility] [address], [place], [address] Phone: [phone]   Fax:  [phone]  Name: Laura Ramos MRN: [id number] Date of Birth: Mar 03, 1933

## 2021-04-14 ENCOUNTER — Ambulatory Visit: Payer: PPO

## 2021-04-14 DIAGNOSIS — M25511 Pain in right shoulder: Secondary | ICD-10-CM

## 2021-04-14 DIAGNOSIS — M25611 Stiffness of right shoulder, not elsewhere classified: Secondary | ICD-10-CM

## 2021-04-14 DIAGNOSIS — M6281 Muscle weakness (generalized): Secondary | ICD-10-CM

## 2021-04-14 NOTE — Therapy (Signed)
[facility] [address] [place], [address] Phone: [phone]   Fax:  [phone]  Physical Therapy Treatment  Patient Details  Name: Laura Ramos MRN: [id number] Date of Birth: 1932-10-15 Referring Provider (PT): [place] PA   Encounter Date: 04/14/2021   PT End of Session - 04/14/21 1431     Visit Number 4    Number of Visits 17    Date for PT Re-Evaluation 06/03/21    Authorization - Visit Number 4    Progress Note Due on Visit 10    PT Start Time 9179    PT Stop Time 1512    PT Time Calculation (min) 47 min    Activity Tolerance Patient tolerated treatment well;No increased pain    Behavior During Therapy WFL for tasks assessed/performed             Past Medical History:  Diagnosis Date   Allergy    Artery disease, cerebral    Arthritis    Cancer ([place])    skin   Hyperlipidemia    Osteoporosis    TIA (transient ischemic attack)    x2, last apprx 2007. no deficits   Vertigo    none in several yrs   Wears dentures    partial lower    Past Surgical History:  Procedure Laterality Date   CATARACT EXTRACTION W/ INTRAOCULAR LENS  IMPLANT, BILATERAL     COLONOSCOPY     PTOSIS REPAIR Bilateral 04/10/2017   Procedure: BLEPHAROPTOSIS REPAIR RESECT EX;  Surgeon: Karle Starch, MD;  Location: [facility];  Service: Ophthalmology;  Laterality: Bilateral;   TUBAL LIGATION      There were no vitals filed for this visit.   Subjective Assessment - 04/14/21 1428     Subjective Pt reports just discomfort but no pain. Compliant with HEP.    Pertinent History Pt is an 87 year old female presenting with R shoulder pain that has come on insideously over a couple months. She started noticing pain occuring with overhead reaching completing household ADLs and with overhead lifting in her water aerobics (2x/week). She was seen at [facility] after some time, and had an injection and Xray  yesterday at [facility]. Reports her pain has worsened to the point now that she has pain with any and all reaching and lifting, driving (especially starting her car and turning), sleeping on her R side. Current pain level 5/10;worst 10/10; best 5/10. Reports her pain is diffuse over the whole shoulder when it comes on and if feels sharp, and reports the shot may have dulled her pain somewhat. No numbness or tingling in the UE or the hands. Denies neck or scapula. Pt is R handed. Lives at home alone, is completing her own ADLs but reports they are very difficult, she is doing more takeout than cooking due to pain, and is taking sink baths. Pt is retired, but enjoys socializing with friends, [place], and her water aerobics class. Pt denies N/V, B&B changes, unexplained weight fluctuation, saddle paresthesia, fever, night sweats, or unrelenting night pain at this time.    Limitations Lifting;House hold activities;Reading;Writing    How long can you sit comfortably? N/A    How long can you stand comfortably? N/A    How long can you walk comfortably? N/A    Diagnostic tests Xray 04/04/21 pt reports "there is some narrowing in her shoulder on xray, no fractures"    Patient Stated Goals do what I  need to do without pain    Currently in Pain? Yes    Pain Score 2     Pain Location Shoulder    Pain Orientation Right    Pain Onset 1 to 4 weeks ago            There.ex:   Seated pulley's in scaption and modified abduction: 1x15/UE for shoulder mobility.  Seated scaption to 90 deg no resistance: 1x10 BLE Seated resisted scaption: 2x8 on BUE's with 1# DB Standing 1 #DB passes behind back to improve shoulder IR mobility and strength: CW/CCW, 1x1 minute/direction  Standing ball CCW and CW rotations at 90 degrees for stabilization/strengthening: 2x30sec/direction with soccer ball. Finger walks up wall with foot walk forward at end range x8/2sec hold on RUE Seated scap retractions with RTB: 2x12.  Education provided how to anchor band on door and perform in seated. Added to HEP. Good form/technique with exercise.  Standing: B shoulder ER with YTB: 1x20. Cuing for ebows at torso to improve shoulder ER activation.       PT Education - 04/14/21 1649     Education Details form/technique with exercise    Person(s) Educated Patient    Methods Explanation;Demonstration;Verbal cues;Tactile cues    Comprehension Verbalized understanding;Returned demonstration              PT Short Term Goals - 04/06/21 1409       PT SHORT TERM GOAL #1   Title Pt will be independent with HEP in order to improve strength and decrease pain in order to improve pain-free function at home and work.    Baseline 04/06/21 HEP given    Time 4    Period Weeks    Status New      PT SHORT TERM GOAL #2   Title Pt will demonstrate full passive R shoulder ROM in order to demonstrate increased joint mobility needed for AROM progression    Baseline 04/06/21 flex: 169 abd 139d ER 84d    Time 6    Period Weeks    Status New               PT Long Term Goals - 04/06/21 1409       PT LONG TERM GOAL #1   Title Patient will increase FOTO score to 54 to demonstrate predicted increase in functional mobility to complete ADLs    Baseline 04/06/21 28    Time 8    Period Weeks    Status New      PT LONG TERM GOAL #2   Title Pt will decrease worst pain as reported on NPRS by at least 3 points in order to demonstrate clinically significant reduction in pain.    Baseline 04/06/21 10/10    Time 8    Period Weeks    Status New      PT LONG TERM GOAL #3   Title Pt will demonstrate full active R shoulder ROM in order to complete self care ADLs    Baseline 04/06/21 flex: 19d abd 31d ER: R ear IR L1    Time 8    Period Weeks    Status New      PT LONG TERM GOAL #4   Title Patient will demonstrate gross shoulder and periscapular strengh of at least 45 in order to complete household ADLs    Baseline 04/06/21  gross 2/5    Time 8    Period Weeks    Status New  Plan - 04/14/21 1650     Clinical Impression Statement Continuing to progress R shoulder mobility and strengthening to pt tolerance. Simple progressions made with additional resistance with no increase in pain. Pt still displays most limitations in R soulder abduction as evidenced during pulleys. Pt displaying good scapulothoracic rhythm with protraction and upward rotaitons with overhead exercises. Minor shoulder hiking noted with resisted sacption but pt able to correct after cues. Pt will continues to progress therex as tolerated.    Personal Factors and Comorbidities Age;Fitness;Comorbidity 2;Time since onset of injury/illness/exacerbation;Past/Current Experience    Comorbidities osteoporosis, HLD    Examination-Activity Limitations Bathing;Locomotion Level;Transfers;Squat;Stairs;Toileting    Examination-Participation Restrictions Community Activity;Cleaning;Driving;Laundry;Meal Prep    Stability/Clinical Decision Making Evolving/Moderate complexity    Rehab Potential Good    PT Frequency 2x / week    PT Duration 8 weeks    PT Treatment/Interventions ADLs/Self Care Home Management;Gait training;Stair training;Functional mobility training;Therapeutic activities;Therapeutic exercise;Balance training;Neuromuscular re-education;Patient/family education;Electrical Stimulation;Cryotherapy;Iontophoresis 4mg /ml Dexamethasone;Moist Heat;Traction;Ultrasound;Manual techniques;Dry needling;Joint Manipulations;Spinal Manipulations;Passive range of motion    PT Next Visit Plan AROM and resistance focus as able; focus on scapulohumeral rhythm    PT Home Exercise Plan pulleys aarom flex/abd; isometrics    Consulted and Agree with Plan of Care Patient             Patient will benefit from skilled therapeutic intervention in order to improve the following deficits and impairments:  Abnormal gait, Decreased activity tolerance,  Decreased balance, Decreased mobility, Decreased range of motion, Difficulty walking, Increased muscle spasms, Decreased safety awareness, Decreased endurance, Decreased strength, Postural dysfunction, Improper body mechanics, Pain, Impaired UE functional use, Increased fascial restricitons, Hypomobility  Visit Diagnosis: Acute pain of right shoulder  Stiffness of right shoulder, not elsewhere classified  Muscle weakness (generalized)     Problem List Patient Active Problem List   Diagnosis Date Noted   Mixed hyperlipidemia 11/06/2014   Osteoporosis 11/06/2014   Allergic rhinitis 09/23/2013   Cerebrovascular disease 09/23/2013    Salem Caster. Fairly IV, PT, DPT Physical Therapist- [facility]  04/14/2021, 4:53 PM  [facility] [address], [place], [address] Phone: [phone]   Fax:  [phone]  Name: Laura Ramos MRN: [id number] Date of Birth: 1932/12/11

## 2021-04-19 ENCOUNTER — Encounter: Payer: Self-pay | Admitting: Physical Therapy

## 2021-04-19 ENCOUNTER — Ambulatory Visit: Payer: PPO | Admitting: Physical Therapy

## 2021-04-19 DIAGNOSIS — M6281 Muscle weakness (generalized): Secondary | ICD-10-CM

## 2021-04-19 DIAGNOSIS — M25611 Stiffness of right shoulder, not elsewhere classified: Secondary | ICD-10-CM

## 2021-04-19 DIAGNOSIS — M25511 Pain in right shoulder: Secondary | ICD-10-CM

## 2021-04-19 NOTE — Therapy (Signed)
[facility] [address] [place], [address] Phone: [phone]   Fax:  [phone]  Physical Therapy Treatment  Patient Details  Name: Laura Ramos MRN: [id number] Date of Birth: 1933-02-05 Referring Provider (PT): [place] PA   Encounter Date: 04/19/2021   PT End of Session - 04/19/21 1528     Visit Number 5    Number of Visits 17    Date for PT Re-Evaluation 06/03/21    Authorization Type [employer]    Authorization - Visit Number 5    Progress Note Due on Visit 10    PT Start Time 1520    PT Stop Time 1600    PT Time Calculation (min) 40 min    Activity Tolerance Patient tolerated treatment well;No increased pain    Behavior During Therapy WFL for tasks assessed/performed             Past Medical History:  Diagnosis Date   Allergy    Artery disease, cerebral    Arthritis    Cancer ([place])    skin   Hyperlipidemia    Osteoporosis    TIA (transient ischemic attack)    x2, last apprx 2007. no deficits   Vertigo    none in several yrs   Wears dentures    partial lower    Past Surgical History:  Procedure Laterality Date   CATARACT EXTRACTION W/ INTRAOCULAR LENS  IMPLANT, BILATERAL     COLONOSCOPY     PTOSIS REPAIR Bilateral 04/10/2017   Procedure: BLEPHAROPTOSIS REPAIR RESECT EX;  Surgeon: Karle Starch, MD;  Location: [facility];  Service: Ophthalmology;  Laterality: Bilateral;   TUBAL LIGATION      There were no vitals filed for this visit.   Subjective Assessment - 04/19/21 1524     Subjective Pt reports she lifted a heavy tray yesterday and has had some pain with overhead motions following this 2-3/10. Compliance with HEP without question or concern    Pertinent History Pt is an 87 year old female presenting with R shoulder pain that has come on insideously over a couple months. She started noticing pain occuring with overhead reaching completing household  ADLs and with overhead lifting in her water aerobics (2x/week). She was seen at [facility] after some time, and had an injection and Xray yesterday at [facility]. Reports her pain has worsened to the point now that she has pain with any and all reaching and lifting, driving (especially starting her car and turning), sleeping on her R side. Current pain level 5/10;worst 10/10; best 5/10. Reports her pain is diffuse over the whole shoulder when it comes on and if feels sharp, and reports the shot may have dulled her pain somewhat. No numbness or tingling in the UE or the hands. Denies neck or scapula. Pt is R handed. Lives at home alone, is completing her own ADLs but reports they are very difficult, she is doing more takeout than cooking due to pain, and is taking sink baths. Pt is retired, but enjoys socializing with friends, [place], and her water aerobics class. Pt denies N/V, B&B changes, unexplained weight fluctuation, saddle paresthesia, fever, night sweats, or unrelenting night pain at this time.    How long can you sit comfortably? N/A    How long can you stand comfortably? N/A    How long can you walk comfortably? N/A    Diagnostic tests Xray 04/04/21 pt reports "there is some narrowing  in her shoulder on xray, no fractures"    Pain Onset 1 to 4 weeks ago              Ther-Ex AAROM  walking TRX flex and abd x12 each 2sec hold; difficulty with abd walk outs Bilat scaption with dowel 2x 8/10 with cuing for scapulohumeral rhythm with great carry over; pat reports activation of periscapulars and stretch to pec major/minor and bicep Bilat scaption 2x 6 1# to approx 100d with cuing to prevent excessive shoulder hiking Lat pulldown 15# 2x 8 min cuing for scapulohumeral rhythm with good carry over Bilat ER YTB 2x 10 with min cuing for eccentric control with good carry over Doorway  pec and bicep stretch 30sec hold                          PT Education - 04/19/21 1528      Education Details therex form/technique    Person(s) Educated Patient    Methods Explanation;Demonstration;Verbal cues    Comprehension Verbalized understanding;Returned demonstration;Verbal cues required              PT Short Term Goals - 04/06/21 1409       PT SHORT TERM GOAL #1   Title Pt will be independent with HEP in order to improve strength and decrease pain in order to improve pain-free function at home and work.    Baseline 04/06/21 HEP given    Time 4    Period Weeks    Status New      PT SHORT TERM GOAL #2   Title Pt will demonstrate full passive R shoulder ROM in order to demonstrate increased joint mobility needed for AROM progression    Baseline 04/06/21 flex: 169 abd 139d ER 84d    Time 6    Period Weeks    Status New               PT Long Term Goals - 04/06/21 1409       PT LONG TERM GOAL #1   Title Patient will increase FOTO score to 54 to demonstrate predicted increase in functional mobility to complete ADLs    Baseline 04/06/21 28    Time 8    Period Weeks    Status New      PT LONG TERM GOAL #2   Title Pt will decrease worst pain as reported on NPRS by at least 3 points in order to demonstrate clinically significant reduction in pain.    Baseline 04/06/21 10/10    Time 8    Period Weeks    Status New      PT LONG TERM GOAL #3   Title Pt will demonstrate full active R shoulder ROM in order to complete self care ADLs    Baseline 04/06/21 flex: 19d abd 31d ER: R ear IR L1    Time 8    Period Weeks    Status New      PT LONG TERM GOAL #4   Title Patient will demonstrate gross shoulder and periscapular strengh of at least 45 in order to complete household ADLs    Baseline 04/06/21 gross 2/5    Time 8    Period Weeks    Status New                   Plan - 04/19/21 1555     Clinical Impression Statement PT continued therex progression for increased mobility  and strengthening for overhead motions with success. Pt is  able to comply with all cuing for proper technique of therex with good motivation throughout session. patient is continuing to demonstrate better AAROM and AROM (continued abd deficit) with biggest deficit in resisted motion, with difficulty maintaining scapulohumeral rhythm with resisted stuff. PT will continue progression as able    Personal Factors and Comorbidities Age;Fitness;Comorbidity 2;Time since onset of injury/illness/exacerbation;Past/Current Experience    Comorbidities osteoporosis, HLD    Examination-Activity Limitations Bathing;Locomotion Level;Transfers;Squat;Stairs;Toileting    Examination-Participation Restrictions Community Activity;Cleaning;Driving;Laundry;Meal Prep    Stability/Clinical Decision Making Evolving/Moderate complexity    Clinical Decision Making Moderate    Rehab Potential Good    PT Frequency 2x / week    PT Duration 8 weeks    PT Treatment/Interventions ADLs/Self Care Home Management;Gait training;Stair training;Functional mobility training;Therapeutic activities;Therapeutic exercise;Balance training;Neuromuscular re-education;Patient/family education;Electrical Stimulation;Cryotherapy;Iontophoresis 4mg /ml Dexamethasone;Moist Heat;Traction;Ultrasound;Manual techniques;Dry needling;Joint Manipulations;Spinal Manipulations;Passive range of motion    PT Next Visit Plan AROM and resistance focus as able; focus on scapulohumeral rhythm    PT Home Exercise Plan pulleys aarom flex/abd; isometrics    Consulted and Agree with Plan of Care Patient             Patient will benefit from skilled therapeutic intervention in order to improve the following deficits and impairments:  Abnormal gait, Decreased activity tolerance, Decreased balance, Decreased mobility, Decreased range of motion, Difficulty walking, Increased muscle spasms, Decreased safety awareness, Decreased endurance, Decreased strength, Postural dysfunction, Improper body mechanics, Pain, Impaired UE  functional use, Increased fascial restricitons, Hypomobility  Visit Diagnosis: Acute pain of right shoulder  Stiffness of right shoulder, not elsewhere classified  Muscle weakness (generalized)     Problem List Patient Active Problem List   Diagnosis Date Noted   Mixed hyperlipidemia 11/06/2014   Osteoporosis 11/06/2014   Allergic rhinitis 09/23/2013   Cerebrovascular disease 09/23/2013   Durwin Reges DPT Durwin Reges, PT 04/19/2021, 4:43 PM  [facility] [address] [place], [address] Phone: [phone]   Fax:  [phone]  Name: Laura Ramos MRN: [id number] Date of Birth: 08-29-1932

## 2021-04-21 ENCOUNTER — Encounter: Payer: Self-pay | Admitting: Physical Therapy

## 2021-04-21 ENCOUNTER — Ambulatory Visit: Payer: PPO | Admitting: Physical Therapy

## 2021-04-21 DIAGNOSIS — M25511 Pain in right shoulder: Secondary | ICD-10-CM

## 2021-04-21 DIAGNOSIS — M25611 Stiffness of right shoulder, not elsewhere classified: Secondary | ICD-10-CM

## 2021-04-21 DIAGNOSIS — M6281 Muscle weakness (generalized): Secondary | ICD-10-CM

## 2021-04-21 NOTE — Therapy (Signed)
[facility] [address], [place], [address] Phone: [phone]   Fax:  [phone]  Physical Therapy Treatment  Patient Details  Name: Laura Ramos MRN: [id number] Date of Birth: 1933-01-13 Referring Provider (PT): [place] PA   Encounter Date: 04/21/2021   PT End of Session - 04/21/21 1523     Visit Number 6    Number of Visits 17    Date for PT Re-Evaluation 06/03/21    Authorization - Visit Number 6    Progress Note Due on Visit 10    PT Start Time 3329    PT Stop Time 1600    PT Time Calculation (min) 41 min    Equipment Utilized During Treatment Gait belt    Activity Tolerance Patient tolerated treatment well;No increased pain    Behavior During Therapy WFL for tasks assessed/performed             Past Medical History:  Diagnosis Date   Allergy    Artery disease, cerebral    Arthritis    Cancer ([place])    skin   Hyperlipidemia    Osteoporosis    TIA (transient ischemic attack)    x2, last apprx 2007. no deficits   Vertigo    none in several yrs   Wears dentures    partial lower    Past Surgical History:  Procedure Laterality Date   CATARACT EXTRACTION W/ INTRAOCULAR LENS  IMPLANT, BILATERAL     COLONOSCOPY     PTOSIS REPAIR Bilateral 04/10/2017   Procedure: BLEPHAROPTOSIS REPAIR RESECT EX;  Surgeon: Karle Starch, MD;  Location: [facility];  Service: Ophthalmology;  Laterality: Bilateral;   TUBAL LIGATION      There were no vitals filed for this visit.   Subjective Assessment - 04/21/21 1518     Subjective Patient reports she is doing pretty well, no pain today. Reports feeling good after last session, good compliance with HEP.    Pertinent History Pt is an 87 year old female presenting with R shoulder pain that has come on insideously over a couple months. She started noticing pain occuring with overhead reaching completing household ADLs and with overhead lifting  in her water aerobics (2x/week). She was seen at [facility] after some time, and had an injection and Xray yesterday at [facility]. Reports her pain has worsened to the point now that she has pain with any and all reaching and lifting, driving (especially starting her car and turning), sleeping on her R side. Current pain level 5/10;worst 10/10; best 5/10. Reports her pain is diffuse over the whole shoulder when it comes on and if feels sharp, and reports the shot may have dulled her pain somewhat. No numbness or tingling in the UE or the hands. Denies neck or scapula. Pt is R handed. Lives at home alone, is completing her own ADLs but reports they are very difficult, she is doing more takeout than cooking due to pain, and is taking sink baths. Pt is retired, but enjoys socializing with friends, [place], and her water aerobics class. Pt denies N/V, B&B changes, unexplained weight fluctuation, saddle paresthesia, fever, night sweats, or unrelenting night pain at this time.    Limitations Lifting;House hold activities;Reading;Writing    How long can you sit comfortably? N/A    How long can you stand comfortably? N/A    How long can you walk comfortably? N/A    Diagnostic tests Xray 04/04/21 pt reports "there  is some narrowing in her shoulder on xray, no fractures"    Patient Stated Goals do what I need to do without pain    Pain Onset 1 to 4 weeks ago              Ther-Ex AAROM  walking TRX flex and abd x12 each 2sec hold; difficulty with abd walk outs Following full ER and IR; full overhead flex compared to LUE (deficit of approx 20d bilat); difficulty with true abd without heavy protraction bilat  Snow angels on wall x3 ceased d/t pain; in supine x12   Prone scapular retraction + bilat shoulder ext + attempt head/chest lift 2x 10   Bilat ER YTB seated x10 with cuing for posture with good carry over; standing with scapulae against wall 2x 10 with difficulty with occiput connection to wall  d/t thoracic kyphosis  Bilat scaption 2x 6 1# to approx 100d with cuing to prevent excessive shoulder hiking  Thoracolumbar ext over towel roll with hands behind head x12 with min cuing for breath control with good carry over  Doorway  pec and bicep stretch 30sec hold                          PT Education - 04/21/21 1523     Education Details therex form/technique    Person(s) Educated Patient    Methods Explanation;Demonstration;Verbal cues    Comprehension Verbalized understanding;Returned demonstration;Verbal cues required              PT Short Term Goals - 04/06/21 1409       PT SHORT TERM GOAL #1   Title Pt will be independent with HEP in order to improve strength and decrease pain in order to improve pain-free function at home and work.    Baseline 04/06/21 HEP given    Time 4    Period Weeks    Status New      PT SHORT TERM GOAL #2   Title Pt will demonstrate full passive R shoulder ROM in order to demonstrate increased joint mobility needed for AROM progression    Baseline 04/06/21 flex: 169 abd 139d ER 84d    Time 6    Period Weeks    Status New               PT Long Term Goals - 04/06/21 1409       PT LONG TERM GOAL #1   Title Patient will increase FOTO score to 54 to demonstrate predicted increase in functional mobility to complete ADLs    Baseline 04/06/21 28    Time 8    Period Weeks    Status New      PT LONG TERM GOAL #2   Title Pt will decrease worst pain as reported on NPRS by at least 3 points in order to demonstrate clinically significant reduction in pain.    Baseline 04/06/21 10/10    Time 8    Period Weeks    Status New      PT LONG TERM GOAL #3   Title Pt will demonstrate full active R shoulder ROM in order to complete self care ADLs    Baseline 04/06/21 flex: 19d abd 31d ER: R ear IR L1    Time 8    Period Weeks    Status New      PT LONG TERM GOAL #4   Title Patient will demonstrate gross shoulder  and periscapular strengh  of at least 71 in order to complete household ADLs    Baseline 04/06/21 gross 2/5    Time 8    Period Weeks    Status New                   Plan - 04/21/21 1538     Clinical Impression Statement PT continued therex progression for increased scapulothoracic mobility and strengthening with success. Patint is able to comply with all cuing for proper technique of therex, with increased scapulohumeral rhythm, without pain throughout session. Patient's increased thoracic kyphosis inhibits full shoulder ROM and scapulohumeral rhythm, which she understands and is able to correct toward neutral posture well. Patient is motivated throughout session without increased pain. PT will continue progression as able.    Personal Factors and Comorbidities Age;Fitness;Comorbidity 2;Time since onset of injury/illness/exacerbation;Past/Current Experience    Comorbidities osteoporosis, HLD    Examination-Activity Limitations Bathing;Locomotion Level;Transfers;Squat;Stairs;Toileting    Examination-Participation Restrictions Community Activity;Cleaning;Driving;Laundry;Meal Prep    Stability/Clinical Decision Making Evolving/Moderate complexity    Clinical Decision Making Moderate    Rehab Potential Good    PT Frequency 2x / week    PT Duration 8 weeks    PT Treatment/Interventions ADLs/Self Care Home Management;Gait training;Stair training;Functional mobility training;Therapeutic activities;Therapeutic exercise;Balance training;Neuromuscular re-education;Patient/family education;Electrical Stimulation;Cryotherapy;Iontophoresis 4mg /ml Dexamethasone;Moist Heat;Traction;Ultrasound;Manual techniques;Dry needling;Joint Manipulations;Spinal Manipulations;Passive range of motion    PT Next Visit Plan AROM and resistance focus as able; focus on scapulohumeral rhythm    PT Home Exercise Plan pulleys aarom flex/abd; isometrics    Consulted and Agree with Plan of Care Patient              Patient will benefit from skilled therapeutic intervention in order to improve the following deficits and impairments:  Abnormal gait, Decreased activity tolerance, Decreased balance, Decreased mobility, Decreased range of motion, Difficulty walking, Increased muscle spasms, Decreased safety awareness, Decreased endurance, Decreased strength, Postural dysfunction, Improper body mechanics, Pain, Impaired UE functional use, Increased fascial restricitons, Hypomobility, Impaired perceived functional ability  Visit Diagnosis: Acute pain of right shoulder  Stiffness of right shoulder, not elsewhere classified  Muscle weakness (generalized)     Problem List Patient Active Problem List   Diagnosis Date Noted   Mixed hyperlipidemia 11/06/2014   Osteoporosis 11/06/2014   Allergic rhinitis 09/23/2013   Cerebrovascular disease 09/23/2013   Durwin Reges DPT Durwin Reges, PT 04/21/2021, 4:01 PM  [facility] [address], [place], [address] Phone: [phone]   Fax:  [phone]  Name: SIMRANJIT THAYER MRN: [id number] Date of Birth: 04-06-1933

## 2021-04-26 ENCOUNTER — Encounter: Payer: Self-pay | Admitting: Physical Therapy

## 2021-04-26 ENCOUNTER — Ambulatory Visit: Payer: PPO | Attending: Orthopedic Surgery | Admitting: Physical Therapy

## 2021-04-26 DIAGNOSIS — M25611 Stiffness of right shoulder, not elsewhere classified: Secondary | ICD-10-CM | POA: Diagnosis not present

## 2021-04-26 DIAGNOSIS — M6281 Muscle weakness (generalized): Secondary | ICD-10-CM | POA: Diagnosis not present

## 2021-04-26 DIAGNOSIS — M25511 Pain in right shoulder: Secondary | ICD-10-CM | POA: Insufficient documentation

## 2021-04-26 NOTE — Therapy (Signed)
[facility] [address] [place], [address] Phone: [phone]   Fax:  [phone]  Physical Therapy Treatment  Patient Details  Name: Laura Ramos MRN: [id number] Date of Birth: 09-Jan-1933 Referring Provider (PT): [place] PA   Encounter Date: 04/26/2021   PT End of Session - 04/26/21 1447     Visit Number 7    Number of Visits 17    Date for PT Re-Evaluation 06/03/21    Authorization Type [employer]    Authorization - Visit Number 7    Progress Note Due on Visit 10    PT Start Time 2248    PT Stop Time 1512    PT Time Calculation (min) 39 min    Activity Tolerance Patient tolerated treatment well;No increased pain    Behavior During Therapy WFL for tasks assessed/performed             Past Medical History:  Diagnosis Date   Allergy    Artery disease, cerebral    Arthritis    Cancer ([place])    skin   Hyperlipidemia    Osteoporosis    TIA (transient ischemic attack)    x2, last apprx 2007. no deficits   Vertigo    none in several yrs   Wears dentures    partial lower    Past Surgical History:  Procedure Laterality Date   CATARACT EXTRACTION W/ INTRAOCULAR LENS  IMPLANT, BILATERAL     COLONOSCOPY     PTOSIS REPAIR Bilateral 04/10/2017   Procedure: BLEPHAROPTOSIS REPAIR RESECT EX;  Surgeon: Karle Starch, MD;  Location: [facility];  Service: Ophthalmology;  Laterality: Bilateral;   TUBAL LIGATION      There were no vitals filed for this visit.   Subjective Assessment - 04/26/21 1436     Subjective Reports she is feeling good overall. She is completing HEP and returned to water aerobics yesterday and was able to complete them well without pain which she is excited about. No pain today.    Pertinent History Pt is an 88 year old female presenting with R shoulder pain that has come on insideously over a couple months. She started noticing pain occuring with overhead  reaching completing household ADLs and with overhead lifting in her water aerobics (2x/week). She was seen at [facility] after some time, and had an injection and Xray yesterday at [facility]. Reports her pain has worsened to the point now that she has pain with any and all reaching and lifting, driving (especially starting her car and turning), sleeping on her R side. Current pain level 5/10;worst 10/10; best 5/10. Reports her pain is diffuse over the whole shoulder when it comes on and if feels sharp, and reports the shot may have dulled her pain somewhat. No numbness or tingling in the UE or the hands. Denies neck or scapula. Pt is R handed. Lives at home alone, is completing her own ADLs but reports they are very difficult, she is doing more takeout than cooking due to pain, and is taking sink baths. Pt is retired, but enjoys socializing with friends, [place], and her water aerobics class. Pt denies N/V, B&B changes, unexplained weight fluctuation, saddle paresthesia, fever, night sweats, or unrelenting night pain at this time.    Limitations Lifting;House hold activities;Reading;Writing    How long can you sit comfortably? N/A    How long can you stand comfortably? N/A    How long can you walk comfortably?  N/A    Diagnostic tests Xray 04/04/21 pt reports "there is some narrowing in her shoulder on xray, no fractures"    Patient Stated Goals do what I need to do without pain    Pain Onset 1 to 4 weeks ago            Ther-Ex AAROM  walking TRX flex and abd x12 each 2sec hold; difficulty with abd walk outs Following full ER and IR; full overhead flex compared to LUE (deficit of approx 20d bilat); difficulty with true abd without heavy protraction bilat   Snow angels on wall x10 with cuing for scapular retraction into wall; in supine x12    Prone W 2x 10 (attempted Y unable) with cuing for scapular retraction with good carry over   Band pull aparts RTB 2x 10 standing against wall with  cuing for cervical and scap retractions into wall with decent carry over   Bilat scaption 2x 6 1# to approx 100d with cuing to prevent excessive shoulder hiking   Thoracolumbar ext over towel roll with hands behind head x12 with min cuing for breath control with good carry over                                 PT Education - 04/26/21 1440     Education Details therex form/technique    Person(s) Educated Patient    Methods Explanation;Demonstration;Verbal cues    Comprehension Verbalized understanding;Returned demonstration;Verbal cues required              PT Short Term Goals - 04/06/21 1409       PT SHORT TERM GOAL #1   Title Pt will be independent with HEP in order to improve strength and decrease pain in order to improve pain-free function at home and work.    Baseline 04/06/21 HEP given    Time 4    Period Weeks    Status New      PT SHORT TERM GOAL #2   Title Pt will demonstrate full passive R shoulder ROM in order to demonstrate increased joint mobility needed for AROM progression    Baseline 04/06/21 flex: 169 abd 139d ER 84d    Time 6    Period Weeks    Status New               PT Long Term Goals - 04/06/21 1409       PT LONG TERM GOAL #1   Title Patient will increase FOTO score to 54 to demonstrate predicted increase in functional mobility to complete ADLs    Baseline 04/06/21 28    Time 8    Period Weeks    Status New      PT LONG TERM GOAL #2   Title Pt will decrease worst pain as reported on NPRS by at least 3 points in order to demonstrate clinically significant reduction in pain.    Baseline 04/06/21 10/10    Time 8    Period Weeks    Status New      PT LONG TERM GOAL #3   Title Pt will demonstrate full active R shoulder ROM in order to complete self care ADLs    Baseline 04/06/21 flex: 19d abd 31d ER: R ear IR L1    Time 8    Period Weeks    Status New      PT LONG TERM GOAL #4  Title Patient will  demonstrate gross shoulder and periscapular strengh of at least 45 in order to complete household ADLs    Baseline 04/06/21 gross 2/5    Time 8    Period Weeks    Status New                   Plan - 04/26/21 1525     Clinical Impression Statement PT continued therex progression for increased scapulothoracic mobility, and periscapular and shoulder strengthening with success.Patien tis able to comply with cuing for proper technique with good motivation and no increased pain throughout session. Patinet demonstrating better shoulder mobility with increased scapular and thoracic mobility as well, allowing for better scapulohumeral rhythm. PT will continue progression as able.    Personal Factors and Comorbidities Age;Fitness;Comorbidity 2;Time since onset of injury/illness/exacerbation;Past/Current Experience    Comorbidities osteoporosis, HLD    Examination-Activity Limitations Bathing;Locomotion Level;Transfers;Squat;Stairs;Toileting    Examination-Participation Restrictions Community Activity;Cleaning;Driving;Laundry;Meal Prep    Stability/Clinical Decision Making Evolving/Moderate complexity    Clinical Decision Making Moderate    Rehab Potential Good    PT Frequency 2x / week    PT Duration 8 weeks    PT Treatment/Interventions ADLs/Self Care Home Management;Gait training;Stair training;Functional mobility training;Therapeutic activities;Therapeutic exercise;Balance training;Neuromuscular re-education;Patient/family education;Electrical Stimulation;Cryotherapy;Iontophoresis 4mg /ml Dexamethasone;Moist Heat;Traction;Ultrasound;Manual techniques;Dry needling;Joint Manipulations;Spinal Manipulations;Passive range of motion    PT Next Visit Plan AROM and resistance focus as able; focus on scapulohumeral rhythm    PT Home Exercise Plan pulleys aarom flex/abd; isometrics    Consulted and Agree with Plan of Care Patient             Patient will benefit from skilled therapeutic  intervention in order to improve the following deficits and impairments:  Abnormal gait, Decreased activity tolerance, Decreased balance, Decreased mobility, Decreased range of motion, Difficulty walking, Increased muscle spasms, Decreased safety awareness, Decreased endurance, Decreased strength, Postural dysfunction, Improper body mechanics, Pain, Impaired UE functional use, Increased fascial restricitons, Hypomobility, Impaired perceived functional ability  Visit Diagnosis: Acute pain of right shoulder  Stiffness of right shoulder, not elsewhere classified     Problem List Patient Active Problem List   Diagnosis Date Noted   Mixed hyperlipidemia 11/06/2014   Osteoporosis 11/06/2014   Allergic rhinitis 09/23/2013   Cerebrovascular disease 09/23/2013   Durwin Reges DPT Durwin Reges, PT 04/26/2021, 3:45 PM  [facility] [address] [place], [address] Phone: [phone]   Fax:  [phone]  Name: Laura Ramos MRN: [id number] Date of Birth: 07/14/1932

## 2021-04-28 ENCOUNTER — Encounter: Payer: Self-pay | Admitting: Physical Therapy

## 2021-04-28 ENCOUNTER — Ambulatory Visit: Payer: PPO | Admitting: Physical Therapy

## 2021-04-28 ENCOUNTER — Other Ambulatory Visit: Payer: Self-pay

## 2021-04-28 DIAGNOSIS — M25511 Pain in right shoulder: Secondary | ICD-10-CM

## 2021-04-28 DIAGNOSIS — M25611 Stiffness of right shoulder, not elsewhere classified: Secondary | ICD-10-CM

## 2021-04-28 NOTE — Therapy (Signed)
[facility] [address], [place], [address] Phone: [phone]   Fax:  [phone]  Physical Therapy Treatment  Patient Details  Name: Laura Ramos MRN: [id number] Date of Birth: 1932/10/18 Referring Provider (PT): Mulhall PA   Encounter Date: 04/28/2021   PT End of Session - 04/28/21 1442     Visit Number 8    Number of Visits 17    Date for PT Re-Evaluation 06/03/21    Authorization Type [employer]    Authorization Time Period 01/21/20-03/17/20    Authorization - Visit Number 8    Progress Note Due on Visit 10    PT Start Time 1430    PT Stop Time 1510    PT Time Calculation (min) 40 min    Activity Tolerance Patient tolerated treatment well;No increased pain    Behavior During Therapy WFL for tasks assessed/performed             Past Medical History:  Diagnosis Date   Allergy    Artery disease, cerebral    Arthritis    Cancer ([place])    skin   Hyperlipidemia    Osteoporosis    TIA (transient ischemic attack)    x2, last apprx 2007. no deficits   Vertigo    none in several yrs   Wears dentures    partial lower    Past Surgical History:  Procedure Laterality Date   CATARACT EXTRACTION W/ INTRAOCULAR LENS  IMPLANT, BILATERAL     COLONOSCOPY     PTOSIS REPAIR Bilateral 04/10/2017   Procedure: BLEPHAROPTOSIS REPAIR RESECT EX;  Surgeon: Karle Starch, MD;  Location: [facility];  Service: Ophthalmology;  Laterality: Bilateral;   TUBAL LIGATION      There were no vitals filed for this visit.   Subjective Assessment - 04/28/21 1436     Subjective Pt reports she is doing well today, is not having any pain today. Reports she is completing her HEP and is having no issue with her water aerobics class which she is happy about    Pertinent History Pt is an 88 year old female presenting with R shoulder pain that has come on insideously over a couple months. She started  noticing pain occuring with overhead reaching completing household ADLs and with overhead lifting in her water aerobics (2x/week). She was seen at [facility] after some time, and had an injection and Xray yesterday at [facility]. Reports her pain has worsened to the point now that she has pain with any and all reaching and lifting, driving (especially starting her car and turning), sleeping on her R side. Current pain level 5/10;worst 10/10; best 5/10. Reports her pain is diffuse over the whole shoulder when it comes on and if feels sharp, and reports the shot may have dulled her pain somewhat. No numbness or tingling in the UE or the hands. Denies neck or scapula. Pt is R handed. Lives at home alone, is completing her own ADLs but reports they are very difficult, she is doing more takeout than cooking due to pain, and is taking sink baths. Pt is retired, but enjoys socializing with friends, [place], and her water aerobics class. Pt denies N/V, B&B changes, unexplained weight fluctuation, saddle paresthesia, fever, night sweats, or unrelenting night pain at this time.    Limitations Lifting;House hold activities;Reading;Writing    How long can you sit comfortably? N/A    How long can you stand comfortably? N/A  How long can you walk comfortably? N/A    Diagnostic tests Xray 04/04/21 pt reports "there is some narrowing in her shoulder on xray, no fractures"    Patient Stated Goals do what I need to do without pain    Pain Onset 1 to 4 weeks ago             Ther-Ex UBE L5 53mins fwd 72mins bwd for gentle strengthening and mobility    Prone W 3x 10 with cuing for scapular retraction with good carry over  Prone head and chest lift with concordant scapular retraction + depression and bilat shoulder ext 2x 10 with minimal lift  Dowel overhead mini-squat 3x 10 with cuing to maintain scapular retraction with true overhead, with decent carry over  Hip hinge x12 with max cuing needed for proper  technique with decent carry over; holding dowel at occiput and sacrum x12 with better carry over with this tactile cue   Thoracolumbar ext over towel roll with hands behind head x12 with min cuing for breath control with good carry over                           PT Education - 04/28/21 1442     Education Details therex form/technique    Person(s) Educated Patient    Methods Explanation;Demonstration;Verbal cues    Comprehension Verbalized understanding;Returned demonstration;Verbal cues required              PT Short Term Goals - 04/06/21 1409       PT SHORT TERM GOAL #1   Title Pt will be independent with HEP in order to improve strength and decrease pain in order to improve pain-free function at home and work.    Baseline 04/06/21 HEP given    Time 4    Period Weeks    Status New      PT SHORT TERM GOAL #2   Title Pt will demonstrate full passive R shoulder ROM in order to demonstrate increased joint mobility needed for AROM progression    Baseline 04/06/21 flex: 169 abd 139d ER 84d    Time 6    Period Weeks    Status New               PT Long Term Goals - 04/06/21 1409       PT LONG TERM GOAL #1   Title Patient will increase FOTO score to 54 to demonstrate predicted increase in functional mobility to complete ADLs    Baseline 04/06/21 28    Time 8    Period Weeks    Status New      PT LONG TERM GOAL #2   Title Pt will decrease worst pain as reported on NPRS by at least 3 points in order to demonstrate clinically significant reduction in pain.    Baseline 04/06/21 10/10    Time 8    Period Weeks    Status New      PT LONG TERM GOAL #3   Title Pt will demonstrate full active R shoulder ROM in order to complete self care ADLs    Baseline 04/06/21 flex: 19d abd 31d ER: R ear IR L1    Time 8    Period Weeks    Status New      PT LONG TERM GOAL #4   Title Patient will demonstrate gross shoulder and periscapular strengh of at least  45 in order to complete household  ADLs    Baseline 04/06/21 gross 2/5    Time 8    Period Weeks    Status New                   Plan - 04/28/21 1444     Clinical Impression Statement PT continued therex progression for increased scapulothoracic mobility, and shoulder/scapular strength, with progression of functional movements as well with success. Patient is able to comply with cuing for proper technique of all therex with excellent motivation throughout session. Paitent with most difficulty with neutral spine and scapular retraction in gravity dependent hip hinge, with scolosis being a factor in this as well. PT will ocntinue progression as able.    Personal Factors and Comorbidities Age;Fitness;Comorbidity 2;Time since onset of injury/illness/exacerbation;Past/Current Experience    Comorbidities osteoporosis, HLD    Examination-Activity Limitations Bathing;Locomotion Level;Transfers;Squat;Stairs;Toileting    Examination-Participation Restrictions Community Activity;Cleaning;Driving;Laundry;Meal Prep    Stability/Clinical Decision Making Evolving/Moderate complexity    Clinical Decision Making Moderate    Rehab Potential Good    PT Frequency 2x / week    PT Duration 8 weeks    PT Treatment/Interventions ADLs/Self Care Home Management;Gait training;Stair training;Functional mobility training;Therapeutic activities;Therapeutic exercise;Balance training;Neuromuscular re-education;Patient/family education;Electrical Stimulation;Cryotherapy;Iontophoresis 4mg /ml Dexamethasone;Moist Heat;Traction;Ultrasound;Manual techniques;Dry needling;Joint Manipulations;Spinal Manipulations;Passive range of motion    PT Next Visit Plan AROM and resistance focus as able; focus on scapulohumeral rhythm    PT Home Exercise Plan pulleys aarom flex/abd; isometrics    Consulted and Agree with Plan of Care Patient             Patient will benefit from skilled therapeutic intervention in order to  improve the following deficits and impairments:  Abnormal gait, Decreased activity tolerance, Decreased balance, Decreased mobility, Decreased range of motion, Difficulty walking, Increased muscle spasms, Decreased safety awareness, Decreased endurance, Decreased strength, Postural dysfunction, Improper body mechanics, Pain, Impaired UE functional use, Increased fascial restricitons, Hypomobility, Impaired perceived functional ability  Visit Diagnosis: Acute pain of right shoulder  Stiffness of right shoulder, not elsewhere classified     Problem List Patient Active Problem List   Diagnosis Date Noted   Mixed hyperlipidemia 11/06/2014   Osteoporosis 11/06/2014   Allergic rhinitis 09/23/2013   Cerebrovascular disease 09/23/2013   Durwin Reges DPT Durwin Reges, PT 04/28/2021, 3:31 PM  [facility] [address] [place], [address] Phone: [phone]   Fax:  [phone]  Name: Laura Ramos MRN: [id number] Date of Birth: November 21, 1932

## 2021-05-03 ENCOUNTER — Ambulatory Visit: Payer: PPO | Admitting: Physical Therapy

## 2021-05-04 ENCOUNTER — Ambulatory Visit: Payer: PPO | Admitting: Physical Therapy

## 2021-05-04 ENCOUNTER — Encounter: Payer: Self-pay | Admitting: Physical Therapy

## 2021-05-04 DIAGNOSIS — M25611 Stiffness of right shoulder, not elsewhere classified: Secondary | ICD-10-CM

## 2021-05-04 DIAGNOSIS — M25511 Pain in right shoulder: Secondary | ICD-10-CM

## 2021-05-04 NOTE — Therapy (Signed)
[facility] [address], [place], [address] Phone: [phone]   Fax:  [phone]  Physical Therapy Treatment  Patient Details  Name: Laura Ramos MRN: [id number] Date of Birth: 06-04-1932 Referring Provider (PT): Danielson PA   Encounter Date: 05/04/2021   PT End of Session - 05/04/21 1516     Visit Number 9    Number of Visits 17    Date for PT Re-Evaluation 06/03/21    Authorization Type [employer]    Authorization Time Period 01/21/20-03/17/20    Authorization - Visit Number 9    Progress Note Due on Visit 10    PT Start Time 8588    PT Stop Time 1600    PT Time Calculation (min) 44 min    Activity Tolerance Patient tolerated treatment well;No increased pain    Behavior During Therapy WFL for tasks assessed/performed             Past Medical History:  Diagnosis Date   Allergy    Artery disease, cerebral    Arthritis    Cancer ([place])    skin   Hyperlipidemia    Osteoporosis    TIA (transient ischemic attack)    x2, last apprx 2007. no deficits   Vertigo    none in several yrs   Wears dentures    partial lower    Past Surgical History:  Procedure Laterality Date   CATARACT EXTRACTION W/ INTRAOCULAR LENS  IMPLANT, BILATERAL     COLONOSCOPY     PTOSIS REPAIR Bilateral 04/10/2017   Procedure: BLEPHAROPTOSIS REPAIR RESECT EX;  Surgeon: Karle Starch, MD;  Location: [place];  Service: Ophthalmology;  Laterality: Bilateral;   TUBAL LIGATION      There were no vitals filed for this visit.   Subjective Assessment - 05/04/21 1519     Subjective Patient reports she is doing well today, no pain. She had water aerobics today and reports she felt good, but that she is tired following this. Completing HEP with no questions or concerns.    Pertinent History Pt is an 88 year old female presenting with R shoulder pain that has come on insideously over a couple months. She  started noticing pain occuring with overhead reaching completing household ADLs and with overhead lifting in her water aerobics (2x/week). She was seen at [facility] after some time, and had an injection and Xray yesterday at [facility]. Reports her pain has worsened to the point now that she has pain with any and all reaching and lifting, driving (especially starting her car and turning), sleeping on her R side. Current pain level 5/10;worst 10/10; best 5/10. Reports her pain is diffuse over the whole shoulder when it comes on and if feels sharp, and reports the shot may have dulled her pain somewhat. No numbness or tingling in the UE or the hands. Denies neck or scapula. Pt is R handed. Lives at home alone, is completing her own ADLs but reports they are very difficult, she is doing more takeout than cooking due to pain, and is taking sink baths. Pt is retired, but enjoys socializing with friends, [place], and her water aerobics class. Pt denies N/V, B&B changes, unexplained weight fluctuation, saddle paresthesia, fever, night sweats, or unrelenting night pain at this time.    Limitations Lifting;House hold activities;Reading;Writing    How long can you sit comfortably? N/A    How long can you stand comfortably? N/A  How long can you walk comfortably? N/A    Diagnostic tests Xray 04/04/21 pt reports "there is some narrowing in her shoulder on xray, no fractures"    Patient Stated Goals do what I need to do without pain    Pain Onset 1 to 4 weeks ago            Ther-Ex UBE L5 84mins fwd 24mins bwd for gentle strengthening and mobility    Prone W 2x 10 with excellent carry over from last session  Unilateral Y 2x 6 each UE with minimal lift, excellent carry over of scapular motion   Prone head and chest lift with Ues in T position 3x 6 with min cuing for scapular retraction with good carry over   Hip hinge with dowel across upper shoulders x12 with excellent carry over of previous session;  without dowel x6 reps with great carry over- with 5# DB x10 with difficulty maintaining scapular retraction at lowest part of lift   Bent over row 5# DB bilat 2x 8/6 with min cuing for set up position and scapular retraction with good carry over                                                    PT Education - 05/04/21 1521     Education Details therex form/technique    Person(s) Educated Patient    Methods Explanation;Verbal cues    Comprehension Verbalized understanding;Verbal cues required              PT Short Term Goals - 04/06/21 1409       PT SHORT TERM GOAL #1   Title Pt will be independent with HEP in order to improve strength and decrease pain in order to improve pain-free function at home and work.    Baseline 04/06/21 HEP given    Time 4    Period Weeks    Status New      PT SHORT TERM GOAL #2   Title Pt will demonstrate full passive R shoulder ROM in order to demonstrate increased joint mobility needed for AROM progression    Baseline 04/06/21 flex: 169 abd 139d ER 84d    Time 6    Period Weeks    Status New               PT Long Term Goals - 04/06/21 1409       PT LONG TERM GOAL #1   Title Patient will increase FOTO score to 54 to demonstrate predicted increase in functional mobility to complete ADLs    Baseline 04/06/21 28    Time 8    Period Weeks    Status New      PT LONG TERM GOAL #2   Title Pt will decrease worst pain as reported on NPRS by at least 3 points in order to demonstrate clinically significant reduction in pain.    Baseline 04/06/21 10/10    Time 8    Period Weeks    Status New      PT LONG TERM GOAL #3   Title Pt will demonstrate full active R shoulder ROM in order to complete self care ADLs    Baseline 04/06/21 flex: 19d abd 31d ER: R ear IR L1    Time 8    Period Weeks    Status  New      PT LONG TERM GOAL #4   Title Patient will demonstrate gross shoulder and periscapular strengh  of at least 45 in order to complete household ADLs    Baseline 04/06/21 gross 2/5    Time 8    Period Weeks    Status New                   Plan - 05/04/21 1600     Clinical Impression Statement PT continued therex progression for increased scapulothoracic mobility and periscapular strength with success. Patient is able to complete prone Y for first time through this rehabilitation demonstrating increased lower trap activaiton and increased mobility. Patient is able to demostrate good carry over of cuing for scapulohumeral rhythm through functional lifting with success. PT will continue progression as able.    Personal Factors and Comorbidities Age;Fitness;Comorbidity 2;Time since onset of injury/illness/exacerbation;Past/Current Experience    Comorbidities osteoporosis, HLD    Examination-Activity Limitations Bathing;Locomotion Level;Transfers;Squat;Stairs;Toileting    Examination-Participation Restrictions Community Activity;Cleaning;Driving;Laundry;Meal Prep    Stability/Clinical Decision Making Evolving/Moderate complexity    Clinical Decision Making Moderate    Rehab Potential Good    PT Frequency 2x / week    PT Duration 8 weeks    PT Treatment/Interventions ADLs/Self Care Home Management;Gait training;Stair training;Functional mobility training;Therapeutic activities;Therapeutic exercise;Balance training;Neuromuscular re-education;Patient/family education;Electrical Stimulation;Cryotherapy;Iontophoresis 4mg /ml Dexamethasone;Moist Heat;Traction;Ultrasound;Manual techniques;Dry needling;Joint Manipulations;Spinal Manipulations;Passive range of motion    PT Next Visit Plan AROM and resistance focus as able; focus on scapulohumeral rhythm    PT Home Exercise Plan pulleys aarom flex/abd; isometrics    Consulted and Agree with Plan of Care Patient             Patient will benefit from skilled therapeutic intervention in order to improve the following deficits and impairments:   Abnormal gait, Decreased activity tolerance, Decreased balance, Decreased mobility, Decreased range of motion, Difficulty walking, Increased muscle spasms, Decreased safety awareness, Decreased endurance, Decreased strength, Postural dysfunction, Improper body mechanics, Pain, Impaired UE functional use, Increased fascial restricitons, Hypomobility, Impaired perceived functional ability  Visit Diagnosis: Acute pain of right shoulder  Stiffness of right shoulder, not elsewhere classified     Problem List Patient Active Problem List   Diagnosis Date Noted   Mixed hyperlipidemia 11/06/2014   Osteoporosis 11/06/2014   Allergic rhinitis 09/23/2013   Cerebrovascular disease 09/23/2013   Durwin Reges DPT Durwin Reges, PT 05/04/2021, 4:46 PM  [facility] [address], [place], [address] Phone: [phone]   Fax:  [phone]  Name: Laura Ramos MRN: [id number] Date of Birth: 07-31-1932

## 2021-05-05 ENCOUNTER — Ambulatory Visit: Payer: PPO | Admitting: Physical Therapy

## 2021-05-06 ENCOUNTER — Other Ambulatory Visit: Payer: Self-pay

## 2021-05-06 ENCOUNTER — Ambulatory Visit: Payer: PPO | Admitting: Physical Therapy

## 2021-05-06 ENCOUNTER — Encounter: Payer: Self-pay | Admitting: Physical Therapy

## 2021-05-06 DIAGNOSIS — M25511 Pain in right shoulder: Secondary | ICD-10-CM

## 2021-05-06 NOTE — Therapy (Addendum)
[facility] [address], [place], [address] Phone: [phone]   Fax:  [phone]  Physical Therapy Treatment/Progress Note Reporting Period 04/05/21 - 05/06/21  Patient Details  Name: Laura Ramos MRN: [id number] Date of Birth: Apr 03, 1933 Referring Provider (PT): [place] PA   Encounter Date: 05/06/2021   PT End of Session - 05/06/21 0945     Visit Number 10    Number of Visits 17    Date for PT Re-Evaluation 06/03/21    Authorization Type [employer]    Authorization Time Period 01/21/20-03/17/20    Authorization - Visit Number 10    Progress Note Due on Visit 10    PT Start Time 0930    PT Stop Time 1010    PT Time Calculation (min) 40 min    Activity Tolerance Patient tolerated treatment well;No increased pain    Behavior During Therapy WFL for tasks assessed/performed             Past Medical History:  Diagnosis Date   Allergy    Artery disease, cerebral    Arthritis    Cancer ([place])    skin   Hyperlipidemia    Osteoporosis    TIA (transient ischemic attack)    x2, last apprx 2007. no deficits   Vertigo    none in several yrs   Wears dentures    partial lower    Past Surgical History:  Procedure Laterality Date   CATARACT EXTRACTION W/ INTRAOCULAR LENS  IMPLANT, BILATERAL     COLONOSCOPY     PTOSIS REPAIR Bilateral 04/10/2017   Procedure: BLEPHAROPTOSIS REPAIR RESECT EX;  Surgeon: Karle Starch, MD;  Location: [facility];  Service: Ophthalmology;  Laterality: Bilateral;   TUBAL LIGATION      There were no vitals filed for this visit.             Subjective Assessment - 05/06/21 0944     Subjective Pnt reports a "creek" when doing her hair this morining and no pain while mopping yesterday. reports 0/10 on NPS and feeling confident about reducing physical therapy to 1x a week.    Pertinent History Pt is an 88 year old female presenting with R  shoulder pain that has come on insideously over a couple months. She started noticing pain occuring with overhead reaching completing household ADLs and with overhead lifting in her water aerobics (2x/week). She was seen at [facility] after some time, and had an injection and Xray yesterday at [facility]. Reports her pain has worsened to the point now that she has pain with any and all reaching and lifting, driving (especially starting her car and turning), sleeping on her R side. Current pain level 5/10;worst 10/10; best 5/10. Reports her pain is diffuse over the whole shoulder when it comes on and if feels sharp, and reports the shot may have dulled her pain somewhat. No numbness or tingling in the UE or the hands. Denies neck or scapula. Pt is R handed. Lives at home alone, is completing her own ADLs but reports they are very difficult, she is doing more takeout than cooking due to pain, and is taking sink baths. Pt is retired, but enjoys socializing with friends, [place], and her water aerobics class. Pt denies N/V, B&B changes, unexplained weight fluctuation, saddle paresthesia, fever, night sweats, or unrelenting night pain at this time.    Limitations Lifting;House hold activities;Reading;Writing    How long can you sit  comfortably? N/A    How long can you stand comfortably? N/A    How long can you walk comfortably? N/A    Diagnostic tests Xray 04/04/21 pt reports "there is some narrowing in her shoulder on xray, no fractures"    Patient Stated Goals do what I need to do without pain    Pain Onset 1 to 4 weeks ago                Ther-Ex UBE L5 68mns fwd 210ms bwd for gentle strengthening and mobility   MMT and AROM assessment- excellent AROM (full) with paitnet reporting some discomfort with end range abd MMT shoulder 4+/5 gross MMT periscapulars 4-/5 gross  Education on progress currently and continued deficit in periscapular strength and how this is important for heavy household  tasks and posture with understnaind   Unilateral Y 3x 6/6/5 each UE with minimal lift, cuing for eccentric control  Wall angels with cueing to squeeze lower traps together and keep elbows on the wall 2x until fatigue for endurance training     Bent over row 5# DB bilat 2x 8 with min cuing for set up position and scapular retraction with good carry over  PT reviewed the following HEP with patient with patient able to demonstrate a set of the following with min cuing for correction needed. PT educated patient on parameters of therex (how/when to inc/decrease intensity, frequency, rep/set range, stretch hold time, and purpose of therex) with verbalized understanding.  Standing Shoulder Row with Anchored Resistance - 1 x daily - 1-2 x weekly - 3 sets - 6-12 reps Prone Single Arm Shoulder Y - 1-2 x weekly - 3 sets - 6-12 reps Shoulder External Rotation and Scapular Retraction with Resistance - 1-2 x weekly - 3 sets - 6-12 reps                         PT Education - 05/06/21 1038     Education Details therex technique and posture. HEP update    Person(s) Educated Patient    Methods Demonstration;Tactile cues;Verbal cues    Comprehension Returned demonstration              PT Short Term Goals - 05/06/21 0935       PT SHORT TERM GOAL #1   Title Pt will be independent with HEP in order to improve strength and decrease pain in order to improve pain-free function at home and work.    Baseline 04/06/21 HEP given; 05/06/21 Completing regularly    Time 4    Period Weeks    Status New      PT SHORT TERM GOAL #2   Title Pt will demonstrate full passive R shoulder ROM in order to demonstrate increased joint mobility needed for AROM progression    Baseline 04/06/21 flex: 169 abd 139d ER 84d    Time 6    Period Weeks    Status New               PT Long Term Goals - 05/06/21 0935       PT LONG TERM GOAL #1   Title Patient will increase FOTO score to 54 to  demonstrate predicted increase in functional mobility to complete ADLs    Baseline 04/06/21 28; 05/06/21 57    Time 8    Period Weeks    Status Achieved      PT LONG TERM GOAL #2   Title Pt  will decrease worst pain as reported on NPRS by at least 3 points in order to demonstrate clinically significant reduction in pain.    Baseline 04/06/21 10/10 ; 05/06/21 2/10 on NPS    Time 8    Period Weeks    Status Achieved      PT LONG TERM GOAL #3   Title Pt will demonstrate full active R shoulder ROM in order to complete self care ADLs    Baseline 04/06/21 flex: 19d abd 31d ER: R ear IR L1; 05/06/21 full AROM in all planes    Time 8    Period Weeks    Status Achieved      PT LONG TERM GOAL #4   Title Patient will demonstrate gross shoulder and periscapular strengh of at least 4+/5 in order to complete household ADLs    Baseline 04/06/21 gross 2/5; 05/06/21 gross shoulder MMT 4/5 in all planes , gross parascapular (Y and T lower trap and scap retractors) MMT 4-/5 in both planes    Time 8    Period Weeks    Status Partially Met                   Plan - 05/06/21 1040     Clinical Impression Statement PT reassessed goals this visit per 10th visit Medicare compliance. Patien thas made excellent progress toward all goals with clinically significant reduced pain, full active shoulder ROM, and increased shoulder strength. Patient with continued deficit of periscapular strength, which decreases efficiency of overhead mobility, contributes to inactive posture with heavier tasks, and contributes to some pain/discomfort at end range overhead mobility. Pt will benefit from skilled PT to address remaining deficits at a frequency of 1x/week in order to increase independence toward d/c. Patient given updated HEP which she demonstrates and verbalizes understanding of. Pt is able to comply with all cuing for proper technique of therex with modifications needed, with good motivation and no increased pain  throughout session. PT will continue progression as able.    Personal Factors and Comorbidities Age;Fitness;Comorbidity 2;Time since onset of injury/illness/exacerbation;Past/Current Experience    Comorbidities osteoporosis, HLD    Examination-Activity Limitations Bathing;Locomotion Level;Transfers;Squat;Stairs;Toileting    Examination-Participation Restrictions Community Activity;Cleaning;Driving;Laundry;Meal Prep    Stability/Clinical Decision Making Evolving/Moderate complexity    Rehab Potential Good    PT Frequency 2x / week    PT Duration 8 weeks    PT Treatment/Interventions ADLs/Self Care Home Management;Gait training;Stair training;Functional mobility training;Therapeutic activities;Therapeutic exercise;Balance training;Neuromuscular re-education;Patient/family education;Electrical Stimulation;Cryotherapy;Iontophoresis 35m/ml Dexamethasone;Moist Heat;Traction;Ultrasound;Manual techniques;Dry needling;Joint Manipulations;Spinal Manipulations;Passive range of motion    PT Next Visit Plan AROM and resistance focus as able; focus on scapulohumeral rhythm    PT Home Exercise Plan pulleys aarom flex/abd; isometrics    Consulted and Agree with Plan of Care Patient             Patient will benefit from skilled therapeutic intervention in order to improve the following deficits and impairments:  Abnormal gait, Decreased activity tolerance, Decreased balance, Decreased mobility, Decreased range of motion, Difficulty walking, Increased muscle spasms, Decreased safety awareness, Decreased endurance, Decreased strength, Postural dysfunction, Improper body mechanics, Pain, Impaired UE functional use, Increased fascial restricitons, Hypomobility, Impaired perceived functional ability  Visit Diagnosis: Acute pain of right shoulder     Problem List Patient Active Problem List   Diagnosis Date Noted   Mixed hyperlipidemia 11/06/2014   Osteoporosis 11/06/2014   Allergic rhinitis 09/23/2013    Cerebrovascular disease 09/23/2013   KClaiborne BillingsO'Daniel, SPT  CDurwin Reges PT  05/06/2021, 11:20 AM  [facility] [facility] [address], [place], [address] Phone: [phone]   Fax:  [phone]  Name: Laura Ramos MRN: [id number] Date of Birth: 1932/09/24

## 2021-05-10 ENCOUNTER — Ambulatory Visit: Payer: PPO | Admitting: Physical Therapy

## 2021-05-12 ENCOUNTER — Encounter: Payer: Self-pay | Admitting: Physical Therapy

## 2021-05-12 ENCOUNTER — Ambulatory Visit: Payer: PPO | Admitting: Physical Therapy

## 2021-05-12 DIAGNOSIS — M25511 Pain in right shoulder: Secondary | ICD-10-CM | POA: Diagnosis not present

## 2021-05-12 DIAGNOSIS — M6281 Muscle weakness (generalized): Secondary | ICD-10-CM

## 2021-05-12 DIAGNOSIS — M25611 Stiffness of right shoulder, not elsewhere classified: Secondary | ICD-10-CM

## 2021-05-12 NOTE — Therapy (Signed)
[facility] [address] [place], [address] Phone: [phone]   Fax:  [phone]  Physical Therapy Treatment  Patient Details  Name: Laura Ramos MRN: [id number] Date of Birth: Aug 28, 1932 Referring Provider (PT): [place] PA   Encounter Date: 05/12/2021   PT End of Session - 05/12/21 1435     Visit Number 11    Number of Visits 17    Date for PT Re-Evaluation 06/03/21    Authorization Type [employer]    Authorization Time Period 01/21/20-03/17/20    Authorization - Visit Number 1    Progress Note Due on Visit 10    PT Start Time 1430    PT Stop Time 1510    PT Time Calculation (min) 40 min    Activity Tolerance Patient tolerated treatment well;No increased pain    Behavior During Therapy WFL for tasks assessed/performed             Past Medical History:  Diagnosis Date   Allergy    Artery disease, cerebral    Arthritis    Cancer ([place])    skin   Hyperlipidemia    Osteoporosis    TIA (transient ischemic attack)    x2, last apprx 2007. no deficits   Vertigo    none in several yrs   Wears dentures    partial lower    Past Surgical History:  Procedure Laterality Date   CATARACT EXTRACTION W/ INTRAOCULAR LENS  IMPLANT, BILATERAL     COLONOSCOPY     PTOSIS REPAIR Bilateral 04/10/2017   Procedure: BLEPHAROPTOSIS REPAIR RESECT EX;  Surgeon: Karle Starch, MD;  Location: [facility];  Service: Ophthalmology;  Laterality: Bilateral;   TUBAL LIGATION      There were no vitals filed for this visit.   Subjective Assessment - 05/12/21 1433     Subjective Patient reports she has been doing her new HEP and water aerobics without pain. Doing well overall.    Pertinent History Pt is an 88 year old female presenting with R shoulder pain that has come on insideously over a couple months. She started noticing pain occuring with overhead reaching completing household ADLs and with  overhead lifting in her water aerobics (2x/week). She was seen at [facility] after some time, and had an injection and Xray yesterday at [facility]. Reports her pain has worsened to the point now that she has pain with any and all reaching and lifting, driving (especially starting her car and turning), sleeping on her R side. Current pain level 5/10;worst 10/10; best 5/10. Reports her pain is diffuse over the whole shoulder when it comes on and if feels sharp, and reports the shot may have dulled her pain somewhat. No numbness or tingling in the UE or the hands. Denies neck or scapula. Pt is R handed. Lives at home alone, is completing her own ADLs but reports they are very difficult, she is doing more takeout than cooking due to pain, and is taking sink baths. Pt is retired, but enjoys socializing with friends, [place], and her water aerobics class. Pt denies N/V, B&B changes, unexplained weight fluctuation, saddle paresthesia, fever, night sweats, or unrelenting night pain at this time.    Limitations Lifting;House hold activities;Reading;Writing    How long can you sit comfortably? N/A    How long can you stand comfortably? N/A    How long can you walk comfortably? N/A    Diagnostic tests Xray 04/04/21 pt  reports "there is some narrowing in her shoulder on xray, no fractures"    Patient Stated Goals do what I need to do without pain    Pain Onset 1 to 4 weeks ago             Ther-Ex UBE L5 81mns fwd 219ms bwd for gentle strengthening and mobility    Scaption with YTB with back against wall for TC with cuing for full availible ROM and posture with good carry over  Same set up as above horizontal abd (band pull aparts) RTB 3x 10 cuing for eccentric control with good carry over  Lat pulldown 15# 3x 01/30/09 with min cuing initially for proper technique without shoulder hiking with good carry over  Overhead press 2# DB x6 difficulty with proper technique, 1# DB                                PT Education - 05/12/21 1435     Education Details therex form/technique    Person(s) Educated Patient    Methods Explanation;Demonstration;Verbal cues    Comprehension Verbalized understanding;Returned demonstration;Verbal cues required              PT Short Term Goals - 05/06/21 0935       PT SHORT TERM GOAL #1   Title Pt will be independent with HEP in order to improve strength and decrease pain in order to improve pain-free function at home and work.    Baseline 04/06/21 HEP given; 05/06/21 Completing regularly    Time 4    Period Weeks    Status New      PT SHORT TERM GOAL #2   Title Pt will demonstrate full passive R shoulder ROM in order to demonstrate increased joint mobility needed for AROM progression    Baseline 04/06/21 flex: 169 abd 139d ER 84d    Time 6    Period Weeks    Status New               PT Long Term Goals - 05/06/21 0935       PT LONG TERM GOAL #1   Title Patient will increase FOTO score to 54 to demonstrate predicted increase in functional mobility to complete ADLs    Baseline 04/06/21 28; 05/06/21 57    Time 8    Period Weeks    Status Achieved      PT LONG TERM GOAL #2   Title Pt will decrease worst pain as reported on NPRS by at least 3 points in order to demonstrate clinically significant reduction in pain.    Baseline 04/06/21 10/10 ; 05/06/21 2/10 on NPS    Time 8    Period Weeks    Status Achieved      PT LONG TERM GOAL #3   Title Pt will demonstrate full active R shoulder ROM in order to complete self care ADLs    Baseline 04/06/21 flex: 19d abd 31d ER: R ear IR L1; 05/06/21 full AROM in all planes    Time 8    Period Weeks    Status Achieved      PT LONG TERM GOAL #4   Title Patient will demonstrate gross shoulder and periscapular strengh of at least 4+/5 in order to complete household ADLs    Baseline 04/06/21 gross 2/5; 05/06/21 gross shoulder MMT 4/5 in all planes ,  gross parascapular (Y and T lower trap and  scap retractors) MMT 4-/5 in both planes    Time 8    Period Weeks    Status Partially Met                   Plan - 05/12/21 1442     Clinical Impression Statement PT continue therex progression for increased periscapular strength with continued extensor and overhead focus with success. Patient is able to comply with all cuing for proper technique of therex with excellent motivation througout session. Patient is able to self correct shoulder hiking with lat pulldown and seated rows following initial cuing. Patient is motivated throughout session without increased pain. PT will continue progression as able.    Personal Factors and Comorbidities Age;Fitness;Comorbidity 2;Time since onset of injury/illness/exacerbation;Past/Current Experience    Comorbidities osteoporosis, HLD    Examination-Activity Limitations Bathing;Locomotion Level;Transfers;Squat;Stairs;Toileting    Examination-Participation Restrictions Community Activity;Cleaning;Driving;Laundry;Meal Prep    Stability/Clinical Decision Making Evolving/Moderate complexity    Clinical Decision Making Moderate    Rehab Potential Good    PT Frequency 2x / week    PT Duration 8 weeks    PT Treatment/Interventions ADLs/Self Care Home Management;Gait training;Stair training;Functional mobility training;Therapeutic activities;Therapeutic exercise;Balance training;Neuromuscular re-education;Patient/family education;Electrical Stimulation;Cryotherapy;Iontophoresis 54m/ml Dexamethasone;Moist Heat;Traction;Ultrasound;Manual techniques;Dry needling;Joint Manipulations;Spinal Manipulations;Passive range of motion    PT Next Visit Plan AROM and resistance focus as able; focus on scapulohumeral rhythm    PT Home Exercise Plan pulleys aarom flex/abd; isometrics    Consulted and Agree with Plan of Care Patient             Patient will benefit from skilled therapeutic intervention in order to  improve the following deficits and impairments:  Abnormal gait, Decreased activity tolerance, Decreased balance, Decreased mobility, Decreased range of motion, Difficulty walking, Increased muscle spasms, Decreased safety awareness, Decreased endurance, Decreased strength, Postural dysfunction, Improper body mechanics, Pain, Impaired UE functional use, Increased fascial restricitons, Hypomobility, Impaired perceived functional ability  Visit Diagnosis: Acute pain of right shoulder  Stiffness of right shoulder, not elsewhere classified  Muscle weakness (generalized)     Problem List Patient Active Problem List   Diagnosis Date Noted   Mixed hyperlipidemia 11/06/2014   Osteoporosis 11/06/2014   Allergic rhinitis 09/23/2013   Cerebrovascular disease 09/23/2013   CDurwin RegesDPT CDurwin Ramos PT 05/12/2021, 3:17 PM  [facility] [address] [place] [address]Phone: [phone]  Fax:  [phone] Name: Laura VASCOMRN: [id number] of Birth: [birth year]

## 2021-05-17 ENCOUNTER — Encounter: Payer: PPO | Admitting: Physical Therapy

## 2021-05-19 ENCOUNTER — Other Ambulatory Visit: Payer: Self-pay

## 2021-05-19 ENCOUNTER — Ambulatory Visit: Payer: PPO | Admitting: Physical Therapy

## 2021-05-19 ENCOUNTER — Encounter: Payer: Self-pay | Admitting: Physical Therapy

## 2021-05-19 DIAGNOSIS — M25511 Pain in right shoulder: Secondary | ICD-10-CM

## 2021-05-19 DIAGNOSIS — M6281 Muscle weakness (generalized): Secondary | ICD-10-CM

## 2021-05-19 DIAGNOSIS — M25611 Stiffness of right shoulder, not elsewhere classified: Secondary | ICD-10-CM

## 2021-05-19 NOTE — Therapy (Signed)
[facility] [address], [place], [address] Phone: [phone]   Fax:  [phone]  Physical Therapy Treatment/DC Summary Reporting Period 05/06/21 - 05/19/21  Patient Details  Name: Laura Ramos MRN: [id number] Date of Birth: [birth year] Referring Provider (PT): [place] PA   Encounter Date: 05/19/2021   PT End of Session - 05/19/21 1624     Visit Number 12    Number of Visits 17    Date for PT Re-Evaluation 06/03/21    Authorization Type [employer]    Authorization Time Period 01/21/20-03/17/20    Authorization - Visit Number 2    Progress Note Due on Visit 10    PT Start Time 1430    PT Stop Time 1510    PT Time Calculation (min) 40 min    Activity Tolerance Patient tolerated treatment well;No increased pain    Behavior During Therapy WFL for tasks assessed/performed             Past Medical History:  Diagnosis Date   Allergy    Artery disease, cerebral    Arthritis    Cancer ([place])    skin   Hyperlipidemia    Osteoporosis    TIA (transient ischemic attack)    x2, last apprx 2007. no deficits   Vertigo    none in several yrs   Wears dentures    partial lower    Past Surgical History:  Procedure Laterality Date   CATARACT EXTRACTION W/ INTRAOCULAR LENS  IMPLANT, BILATERAL     COLONOSCOPY     PTOSIS REPAIR Bilateral 04/10/2017   Procedure: BLEPHAROPTOSIS REPAIR RESECT EX;  Surgeon: Karle Starch, MD;  Location: [facility];  Service: Ophthalmology;  Laterality: Bilateral;   TUBAL LIGATION      There were no vitals filed for this visit.   Subjective Assessment - 05/19/21 1511     Subjective Pt reports to PT with readiness to d/c if she is able to have a HEP with bands she can complete    Pertinent History Pt is an 88 year old female presenting with R shoulder pain that has come on insideously over a couple months. She started noticing pain occuring with overhead  reaching completing household ADLs and with overhead lifting in her water aerobics (2x/week). She was seen at [facility] after some time, and had an injection and Xray yesterday at [facility]. Reports her pain has worsened to the point now that she has pain with any and all reaching and lifting, driving (especially starting her car and turning), sleeping on her R side. Current pain level 5/10;worst 10/10; best 5/10. Reports her pain is diffuse over the whole shoulder when it comes on and if feels sharp, and reports the shot may have dulled her pain somewhat. No numbness or tingling in the UE or the hands. Denies neck or scapula. Pt is R handed. Lives at home alone, is completing her own ADLs but reports they are very difficult, she is doing more takeout than cooking due to pain, and is taking sink baths. Pt is retired, but enjoys socializing with friends, [place], and her water aerobics class. Pt denies N/V, B&B changes, unexplained weight fluctuation, saddle paresthesia, fever, night sweats, or unrelenting night pain at this time.    Limitations Lifting;House hold activities;Reading;Writing    How long can you sit comfortably? N/A    How long can you stand comfortably? N/A    How long can you walk  comfortably? N/A    Diagnostic tests Xray 04/04/21 pt reports "there is some narrowing in her shoulder on xray, no fractures"    Patient Stated Goals do what I need to do without pain             Ther-Ex UBE L5 30mns fwd 263ms bwd for gentle strengthening and mobility   PT reviewed the following HEP with patient with patient able to demonstrate a set of the following with min cuing for correction needed. PT educated patient on parameters of therex (how/when to inc/decrease intensity, frequency, rep/set range, stretch hold time, and purpose of therex) with verbalized understanding.  Access Code: [email or web address] Low Trap Setting with Resistance at [place] 1 x daily - 1-2 x weekly - 3 sets - 6-12  reps Standing Shoulder Row with Anchored Resistance - 1 x daily - 1-2 x weekly - 3 sets - 6-12 reps Standing Shoulder External Rotation with Resistance - 1 x daily - 1-2 x weekly - 3 sets - 6-12 reps Scaption with Dumbbells - 1 x daily - 1-2 x weekly - 3 sets - 6-12 reps                           PT Education - 05/19/21 1623     Education Details therex form/technique    Person(s) Educated Patient    Methods Explanation;Demonstration;Handout    Comprehension Verbalized understanding;Returned demonstration              PT Short Term Goals - 05/19/21 1626       PT SHORT TERM GOAL #1   Title Pt will be independent with HEP in order to improve strength and decrease pain in order to improve pain-free function at home and work.    Baseline 04/06/21 HEP given; 05/06/21 Completing regularly    Time 4    Period Weeks    Status Achieved      PT SHORT TERM GOAL #2   Title Pt will demonstrate full passive R shoulder ROM in order to demonstrate increased joint mobility needed for AROM progression    Baseline 04/06/21 flex: 169 abd 139d ER 84d; all PROM and AROM WNL    Time 6    Period Weeks    Status Achieved               PT Long Term Goals - 05/19/21 1437       PT LONG TERM GOAL #1   Title Patient will increase FOTO score to 54 to demonstrate predicted increase in functional mobility to complete ADLs    Baseline 04/06/21 28; 05/06/21 57    Time 8    Period Weeks    Status Achieved    Target Date 03/17/20      PT LONG TERM GOAL #2   Title Pt will decrease worst pain as reported on NPRS by at least 3 points in order to demonstrate clinically significant reduction in pain.    Baseline 04/06/21 10/10 ; 05/06/21 2/10 on NPS    Time 8    Period Weeks    Status Achieved    Target Date 03/17/20      PT LONG TERM GOAL #3   Title Pt will demonstrate full active R shoulder ROM in order to complete self care ADLs    Baseline 04/06/21 flex: 19d abd 31d ER:  R ear IR L1; 05/06/21 full AROM in all planes    Time 8  Period Weeks    Status Achieved    Target Date 03/17/20      PT LONG TERM GOAL #4   Title Patient will demonstrate gross shoulder and periscapular strengh of at least 4+/5 in order to complete household ADLs    Baseline 04/06/21 gross 2/5; 05/06/21 gross shoulder MMT 4/5 in all planes , gross parascapular (Y and T lower trap and scap retractors) MMT 4-/5 in both planes    Time 8    Period Weeks                   Plan - 05/19/21 1624     Clinical Impression Statement Pt has met goals to safely d/c PT. Patient with new goal of increased periscapular strength 2 sessions ago, but demosntrates excellent therex technique, with self correction, and safety to continue to work toward this goal independently. Patient is able to demonstrate all therex with proper technique and excellent upright posture. Patient verbalizes undersatnding of parameters of HEP for strengthening as well as continued mobility training. Pt to d/c PT.    Personal Factors and Comorbidities Age;Fitness;Comorbidity 2;Time since onset of injury/illness/exacerbation;Past/Current Experience    Comorbidities osteoporosis, HLD    Examination-Activity Limitations Bathing;Locomotion Level;Transfers;Squat;Stairs;Toileting    Examination-Participation Restrictions Community Activity;Cleaning;Driving;Laundry;Meal Prep    Stability/Clinical Decision Making Evolving/Moderate complexity    Clinical Decision Making Moderate    Rehab Potential Good    PT Frequency 2x / week    PT Duration 8 weeks    PT Treatment/Interventions ADLs/Self Care Home Management;Gait training;Stair training;Functional mobility training;Therapeutic activities;Therapeutic exercise;Balance training;Neuromuscular re-education;Patient/family education;Electrical Stimulation;Cryotherapy;Iontophoresis 54m/ml Dexamethasone;Moist Heat;Traction;Ultrasound;Manual techniques;Dry needling;Joint  Manipulations;Spinal Manipulations;Passive range of motion    PT Next Visit Plan AROM and resistance focus as able; focus on scapulohumeral rhythm    PT Home Exercise Plan pulleys aarom flex/abd; isometrics    Consulted and Agree with Plan of Care Patient             Patient will benefit from skilled therapeutic intervention in order to improve the following deficits and impairments:  Abnormal gait, Decreased activity tolerance, Decreased balance, Decreased mobility, Decreased range of motion, Difficulty walking, Increased muscle spasms, Decreased safety awareness, Decreased endurance, Decreased strength, Postural dysfunction, Improper body mechanics, Pain, Impaired UE functional use, Increased fascial restricitons, Hypomobility, Impaired perceived functional ability  Visit Diagnosis: Acute pain of right shoulder  Stiffness of right shoulder, not elsewhere classified  Muscle weakness (generalized)     Problem List Patient Active Problem List   Diagnosis Date Noted   Mixed hyperlipidemia 11/06/2014   Osteoporosis 11/06/2014   Allergic rhinitis 09/23/2013   Cerebrovascular disease 09/23/2013   CDurwin RegesDPT CDurwin Reges PT 05/19/2021, 4:28 PM  [place] [facility] [address] [place] [id number]: [phone]  Fax:  [phone] Name: Laura KIRCHHOFFMRN: [id number] of Birth: 404/22/1934

## 2021-05-24 ENCOUNTER — Encounter: Payer: PPO | Admitting: Physical Therapy

## 2021-05-26 ENCOUNTER — Encounter: Payer: PPO | Admitting: Physical Therapy

## 2021-06-02 DIAGNOSIS — N1831 Chronic kidney disease, stage 3a: Secondary | ICD-10-CM | POA: Diagnosis not present

## 2021-06-02 DIAGNOSIS — E7800 Pure hypercholesterolemia, unspecified: Secondary | ICD-10-CM | POA: Diagnosis not present

## 2021-06-02 DIAGNOSIS — Z79899 Other long term (current) drug therapy: Secondary | ICD-10-CM | POA: Diagnosis not present

## 2021-06-09 DIAGNOSIS — R300 Dysuria: Secondary | ICD-10-CM | POA: Diagnosis not present

## 2021-06-09 DIAGNOSIS — Z79899 Other long term (current) drug therapy: Secondary | ICD-10-CM | POA: Diagnosis not present

## 2021-06-09 DIAGNOSIS — Z0000 Encounter for general adult medical examination without abnormal findings: Secondary | ICD-10-CM | POA: Diagnosis not present

## 2021-06-09 DIAGNOSIS — N1831 Chronic kidney disease, stage 3a: Secondary | ICD-10-CM | POA: Diagnosis not present

## 2021-06-09 DIAGNOSIS — N390 Urinary tract infection, site not specified: Secondary | ICD-10-CM | POA: Diagnosis not present

## 2021-06-09 DIAGNOSIS — E7800 Pure hypercholesterolemia, unspecified: Secondary | ICD-10-CM | POA: Diagnosis not present

## 2021-06-09 DIAGNOSIS — Z1331 Encounter for screening for depression: Secondary | ICD-10-CM | POA: Diagnosis not present

## 2021-08-05 DIAGNOSIS — H40003 Preglaucoma, unspecified, bilateral: Secondary | ICD-10-CM | POA: Diagnosis not present

## 2021-12-05 DIAGNOSIS — Z79899 Other long term (current) drug therapy: Secondary | ICD-10-CM | POA: Diagnosis not present

## 2021-12-05 DIAGNOSIS — E7800 Pure hypercholesterolemia, unspecified: Secondary | ICD-10-CM | POA: Diagnosis not present

## 2021-12-12 DIAGNOSIS — I679 Cerebrovascular disease, unspecified: Secondary | ICD-10-CM | POA: Diagnosis not present

## 2021-12-12 DIAGNOSIS — E7800 Pure hypercholesterolemia, unspecified: Secondary | ICD-10-CM | POA: Diagnosis not present

## 2021-12-12 DIAGNOSIS — Z79899 Other long term (current) drug therapy: Secondary | ICD-10-CM | POA: Diagnosis not present

## 2021-12-12 DIAGNOSIS — N1831 Chronic kidney disease, stage 3a: Secondary | ICD-10-CM | POA: Diagnosis not present

## 2021-12-12 DIAGNOSIS — R202 Paresthesia of skin: Secondary | ICD-10-CM | POA: Diagnosis not present

## 2021-12-12 DIAGNOSIS — M810 Age-related osteoporosis without current pathological fracture: Secondary | ICD-10-CM | POA: Diagnosis not present

## 2021-12-15 DIAGNOSIS — D225 Melanocytic nevi of trunk: Secondary | ICD-10-CM | POA: Diagnosis not present

## 2021-12-15 DIAGNOSIS — D485 Neoplasm of uncertain behavior of skin: Secondary | ICD-10-CM | POA: Diagnosis not present

## 2021-12-15 DIAGNOSIS — Z85828 Personal history of other malignant neoplasm of skin: Secondary | ICD-10-CM | POA: Diagnosis not present

## 2021-12-15 DIAGNOSIS — X32XXXA Exposure to sunlight, initial encounter: Secondary | ICD-10-CM | POA: Diagnosis not present

## 2021-12-15 DIAGNOSIS — L570 Actinic keratosis: Secondary | ICD-10-CM | POA: Diagnosis not present

## 2021-12-15 DIAGNOSIS — D2262 Melanocytic nevi of left upper limb, including shoulder: Secondary | ICD-10-CM | POA: Diagnosis not present

## 2021-12-15 DIAGNOSIS — D2261 Melanocytic nevi of right upper limb, including shoulder: Secondary | ICD-10-CM | POA: Diagnosis not present

## 2021-12-15 DIAGNOSIS — D2271 Melanocytic nevi of right lower limb, including hip: Secondary | ICD-10-CM | POA: Diagnosis not present

## 2021-12-15 DIAGNOSIS — C44329 Squamous cell carcinoma of skin of other parts of face: Secondary | ICD-10-CM | POA: Diagnosis not present

## 2021-12-20 DIAGNOSIS — M810 Age-related osteoporosis without current pathological fracture: Secondary | ICD-10-CM | POA: Diagnosis not present

## 2022-01-26 DIAGNOSIS — C44329 Squamous cell carcinoma of skin of other parts of face: Secondary | ICD-10-CM | POA: Diagnosis not present

## 2022-02-17 DIAGNOSIS — H40003 Preglaucoma, unspecified, bilateral: Secondary | ICD-10-CM | POA: Diagnosis not present

## 2022-03-08 DIAGNOSIS — L923 Foreign body granuloma of the skin and subcutaneous tissue: Secondary | ICD-10-CM | POA: Diagnosis not present

## 2022-03-13 DIAGNOSIS — R300 Dysuria: Secondary | ICD-10-CM | POA: Diagnosis not present

## 2022-03-13 DIAGNOSIS — N390 Urinary tract infection, site not specified: Secondary | ICD-10-CM | POA: Diagnosis not present

## 2022-03-28 DIAGNOSIS — N390 Urinary tract infection, site not specified: Secondary | ICD-10-CM | POA: Diagnosis not present

## 2022-03-28 DIAGNOSIS — R300 Dysuria: Secondary | ICD-10-CM | POA: Diagnosis not present

## 2022-04-10 DIAGNOSIS — R300 Dysuria: Secondary | ICD-10-CM | POA: Diagnosis not present

## 2022-04-10 DIAGNOSIS — N390 Urinary tract infection, site not specified: Secondary | ICD-10-CM | POA: Diagnosis not present

## 2022-04-19 DIAGNOSIS — N390 Urinary tract infection, site not specified: Secondary | ICD-10-CM | POA: Diagnosis not present

## 2022-05-10 ENCOUNTER — Ambulatory Visit: Payer: Self-pay | Admitting: Urology

## 2022-05-16 ENCOUNTER — Ambulatory Visit (INDEPENDENT_AMBULATORY_CARE_PROVIDER_SITE_OTHER): Payer: PPO | Admitting: Urology

## 2022-05-16 ENCOUNTER — Encounter: Payer: Self-pay | Admitting: Urology

## 2022-05-16 VITALS — BP 136/82 | HR 108 | Ht 62.0 in | Wt 133.0 lb

## 2022-05-16 DIAGNOSIS — N390 Urinary tract infection, site not specified: Secondary | ICD-10-CM | POA: Diagnosis not present

## 2022-05-16 LAB — URINALYSIS, COMPLETE
Bilirubin, UA: NEGATIVE
Glucose, UA: NEGATIVE
Nitrite, UA: POSITIVE — AB
Specific Gravity, UA: 1.015 (ref 1.005–1.030)
Urobilinogen, Ur: 0.2 mg/dL (ref 0.2–1.0)
pH, UA: 6 (ref 5.0–7.5)

## 2022-05-16 LAB — MICROSCOPIC EXAMINATION: WBC, UA: >30 /hpf — AB (ref 0–5)

## 2022-05-16 LAB — BLADDER SCAN AMB NON-IMAGING: Scan Result: 59

## 2022-05-16 MED ORDER — PREMARIN 0.625 MG/GM VA CREA: 1.0000 | TOPICAL_CREAM | Freq: Every day | VAGINAL | 12 refills | Status: DC

## 2022-05-16 MED ORDER — ESTROGENS CONJUGATED 0.625 MG/GM VA CREA: TOPICAL_CREAM | VAGINAL | 11 refills | Status: AC

## 2022-05-16 MED ORDER — CEPHALEXIN 500 MG PO CAPS: 500.0000 mg | ORAL_CAPSULE | Freq: Three times a day (TID) | ORAL | 0 refills | Status: AC

## 2022-05-16 MED ORDER — TRIMETHOPRIM 100 MG PO TABS: 100.0000 mg | ORAL_TABLET | Freq: Every day | ORAL | 0 refills | Status: AC

## 2022-05-16 NOTE — Progress Notes (Signed)
I, DeAsia L Maxie,acting as a scribe for Hollice Espy, MD.,have documented all relevant documentation on the behalf of Hollice Espy, MD,as directed by  Hollice Espy, MD while in the presence of Hollice Espy, MD.   I, Amy L Pierron,acting as a scribe for Hollice Espy, MD.,have documented all relevant documentation on the behalf of Hollice Espy, MD,as directed by  Hollice Espy, MD while in the presence of Hollice Espy, MD.   05/16/22 3:58 PM   Laura Ramos 07/10/1932 [id number]  Referring provider: Derinda Late, MD [address] [facility] [place],  [place] [address]  Chief Complaint  Patient presents with   Establish Care   Recurrent UTI    HPI: 89 year-old female who presents today for further evaluation of her recurrent UTI's.   She first presented to her primary care on March 13, 2022,  complaining of urinary frequency and nocturia along with dysuria and fatigue. Her UA was frankly positive. She was prescribed Cipro '250mg'$  for 5 days. Her urine culture showed pan sensitive E. Coli at that time. Her symptoms completely resolved but then quickly recurred. She was seen back in the office on March 28, 2022, with a similar presentation. She again grew pan sensitive E. Coli. On this occasion she was treated with Keflex '500mg'$  BID for 5 days. She was seen again on April 10, 2022 with urgency, frequency, dysuria along with some coughing and congestion. She was given another round of Keflex and referred to urology. She had a telemedicine visit on April 19, 2022 and was treated with another round of empiric antibiotics and also placed on Oxybutynin. Prior to this cluster of recent UTI's, I don't see a documented infection since 2022. She has no recent upper tract imaging.   She continues to have occasional burning with urination.   Results for orders placed or performed in visit on 05/16/22  Bladder Scan (Post Void Residual)  in office  Result Value Ref Range   Scan Result 9      PMH: Past Medical History:  Diagnosis Date   Allergy    Artery disease, cerebral    Arthritis    Cancer ([place])    skin   Hyperlipidemia    Osteoporosis    TIA (transient ischemic attack)    x2, last apprx 2007. no deficits   Vertigo    none in several yrs   Wears dentures    partial lower    Surgical History: Past Surgical History:  Procedure Laterality Date   CATARACT EXTRACTION W/ INTRAOCULAR LENS  IMPLANT, BILATERAL     COLONOSCOPY     PTOSIS REPAIR Bilateral 04/10/2017   Procedure: BLEPHAROPTOSIS REPAIR RESECT EX;  Surgeon: Karle Starch, MD;  Location: [facility];  Service: Ophthalmology;  Laterality: Bilateral;   TUBAL LIGATION      Home Medications:  Allergies as of 05/16/2022       Reactions   Codeine Nausea And Vomiting   Statins    Other reaction(s): Muscle Pain   Sulfa Antibiotics Other (See Comments)   Amoxicillin Rash   Neosporin [neomycin-bacitracin Zn-polymyx] Rash        Medication List        Accurate as of May 16, 2022  3:58 PM. If you have any questions, ask your nurse or doctor.          acetaminophen 650 MG CR tablet Commonly known as: TYLENOL Take 650 mg by mouth every 8 (eight)  hours as needed for pain.   carboxymethylcellulose 0.5 % Soln Commonly known as: REFRESH PLUS 1 drop 2 (two) times daily as needed.   cephALEXin 500 MG capsule Commonly known as: Keflex Take 1 capsule (500 mg total) by mouth 3 (three) times daily. Started by: Hollice Espy, MD   cetirizine 10 MG tablet Commonly known as: ZYRTEC Take by mouth.   clopidogrel 75 MG tablet Commonly known as: PLAVIX TAKE 1 TABLET EVERY DAY   conjugated estrogens vaginal cream Commonly known as: PREMARIN Discard applicator Apply pea sized amount to tip of finger to urethra before bed. Wash hands well after application. Use Monday, Wednesday and Friday What changed: additional  instructions Changed by: Hollice Espy, MD   erythromycin ophthalmic ointment Commonly known as: Romycin Use a small amount on your sutures 4 times a day for the next 2 weeks. Switch to Aquaphor ointment should allergy develop.   fluticasone 50 MCG/ACT nasal spray Commonly known as: FLONASE Place into both nostrils daily as needed for allergies or rhinitis.   Multi-Vitamins Tabs Take by mouth.   niacin 500 MG ER tablet Commonly known as: VITAMIN B3 Take by mouth.   pyridOXINE 100 MG tablet Commonly known as: VITAMIN B6 Take by mouth.   RA Vitamin B-12 TR 1000 MCG Tbcr Generic drug: Cyanocobalamin Take by mouth.   traMADol 50 MG tablet Commonly known as: Ultram Take 1 every 4-6 hours as needed for pain not controlled by Tylenol   trimethoprim 100 MG tablet Commonly known as: TRIMPEX Take 1 tablet (100 mg total) by mouth daily. Started by: Hollice Espy, MD   Zetia 10 MG tablet Generic drug: ezetimibe TAKE 1 TABLET EVERY DAY FOR CHOLESTEROL        Allergies:  Allergies  Allergen Reactions   Codeine Nausea And Vomiting   Statins     Other reaction(s): Muscle Pain   Sulfa Antibiotics Other (See Comments)   Amoxicillin Rash   Neosporin [Neomycin-Bacitracin Zn-Polymyx] Rash    Family History: Family History  Problem Relation Age of Onset   Breast cancer Neg Hx     Social History:  reports that she has never smoked. She has never used smokeless tobacco. She reports that she does not drink alcohol. No history on file for drug use.   Physical Exam: BP 136/82   Pulse (!) 108   Ht '5\' 2"'$  (1.575 m)   Wt 133 lb (60.3 kg)   BMI 24.33 kg/m   Constitutional:  Alert and oriented, No acute distress. HEENT: [place] AT, moist mucus membranes.  Trachea midline, no masses. Neurologic: Grossly intact, no focal deficits, moving all 4 extremities. Psychiatric: Normal mood and affect.  Assessment & Plan:    Recurrent UTI's - In light of frequent recurrent back-to-back  infections that are symptomatic and all the same organism. We'll evaluate with upper tract imaging in the form of renal ultrasound and perform a cysto/pelvic exam at her follow-up visit.  - Her urinalysis today is frankly positive including for nitrates.There's not enough for culture. We'll go ahead and treat her with an oral antibiotic and transition to a three month course of suppressive antibiotic to try to break the cycle.  - In the interim start her on a topical estrogen cream to apply periurethrally, a pea-sized amount, Monday, Wednesday and Friday. - We'll also recommend supplementation including cranberry tablets BID, daily probiotic, and D-Mannose.   Return for renal ultrasound and cystoscopy.   [place] [address] [place],  [place] [address] [phone]

## 2022-05-23 ENCOUNTER — Ambulatory Visit
Admission: RE | Admit: 2022-05-23 | Discharge: 2022-05-23 | Disposition: A | Discharge status: Home / Self Care | Payer: HMO | Source: Ambulatory Visit | Attending: Urology | Admitting: Urology

## 2022-05-23 DIAGNOSIS — N390 Urinary tract infection, site not specified: Secondary | ICD-10-CM | POA: Diagnosis not present

## 2022-06-06 IMAGING — MG DIGITAL SCREENING BILAT W/ TOMO W/ CAD
8 series · 9 of 24 positions shown · non-contrast
Comparison: Previous exam(s).

CLINICAL DATA: Screening.

EXAM:
DIGITAL SCREENING BILATERAL MAMMOGRAM WITH TOMO AND CAD

[R CC synth-2D]
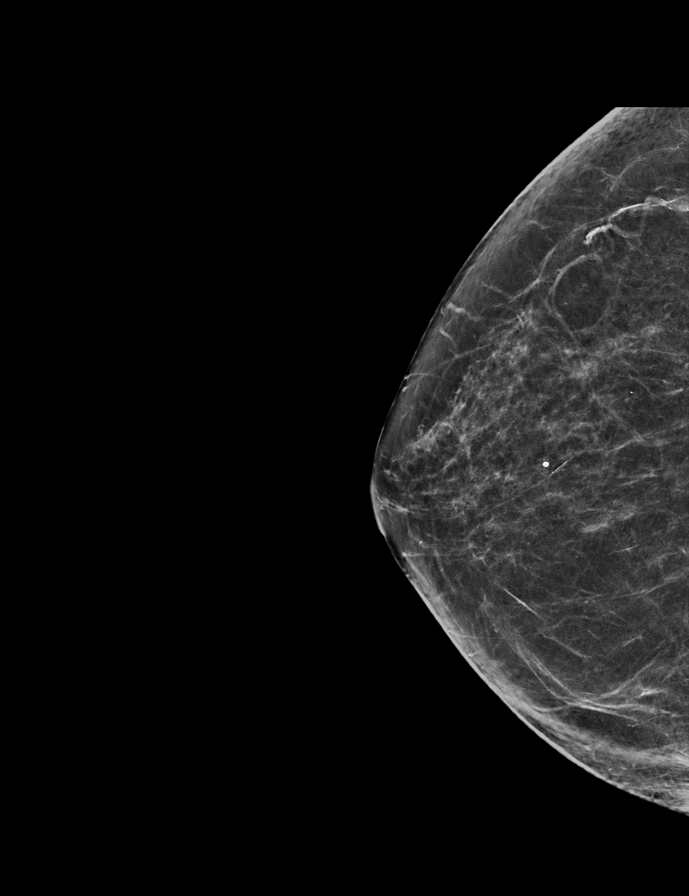

[L MLO synth-2D]
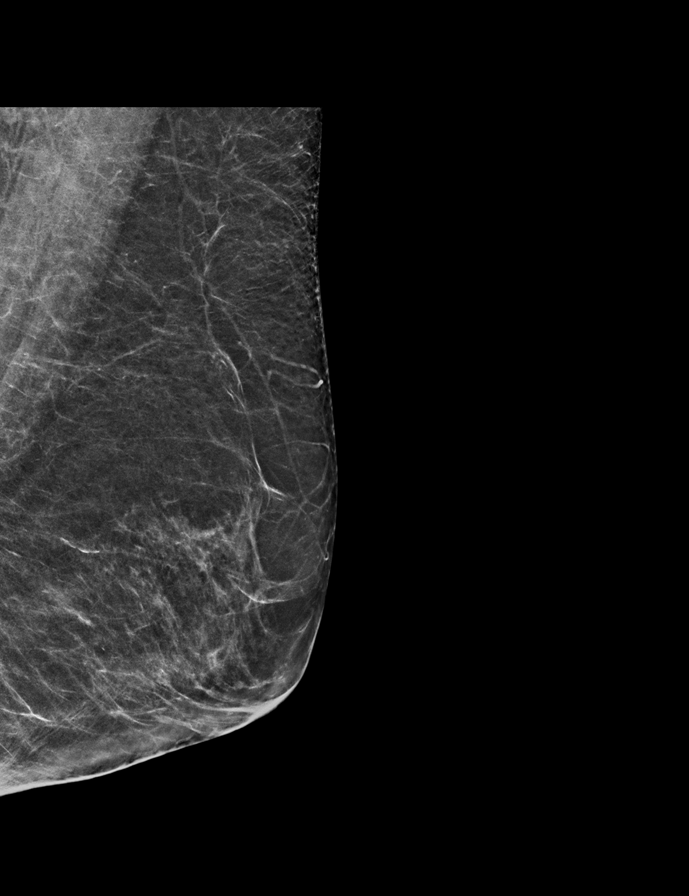

[L CC synth-2D]
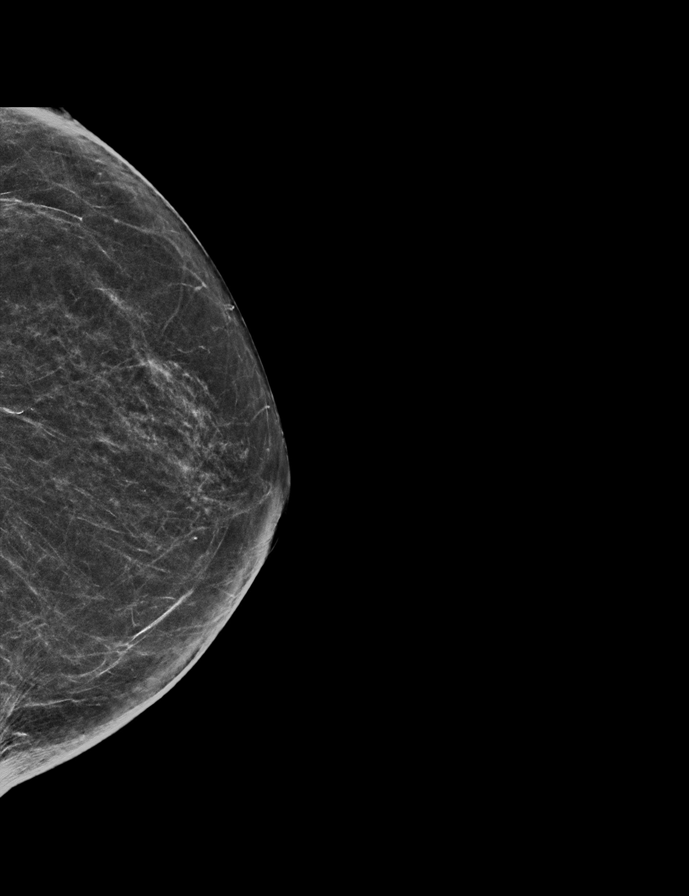

[R MLO synth-2D]
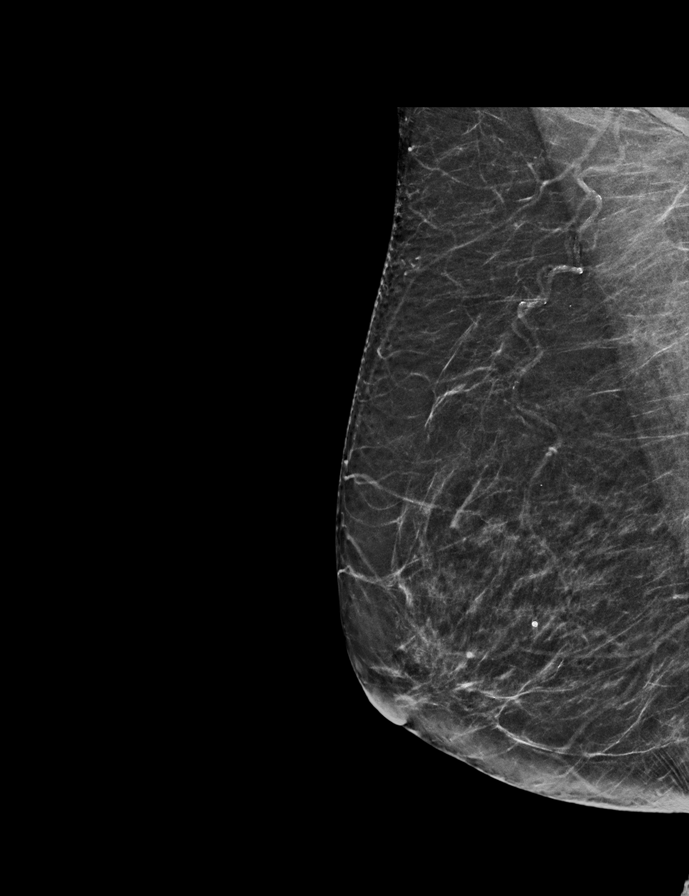

[L CC tomo · 2 of 58 frames shown]
[frame 19/58]
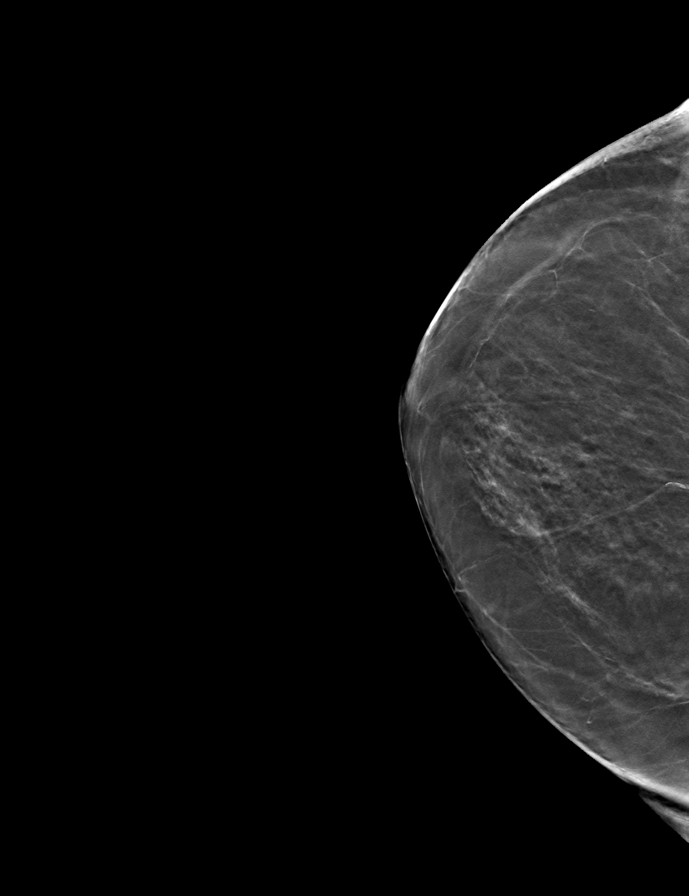
[frame 29/58]
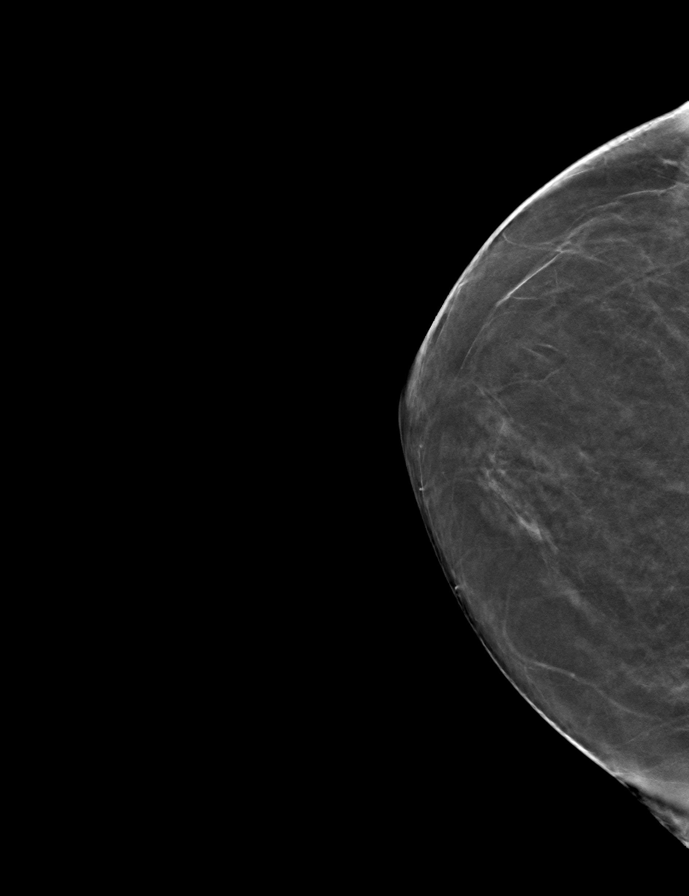

[R MLO tomo · tomo slice 30/59.0]
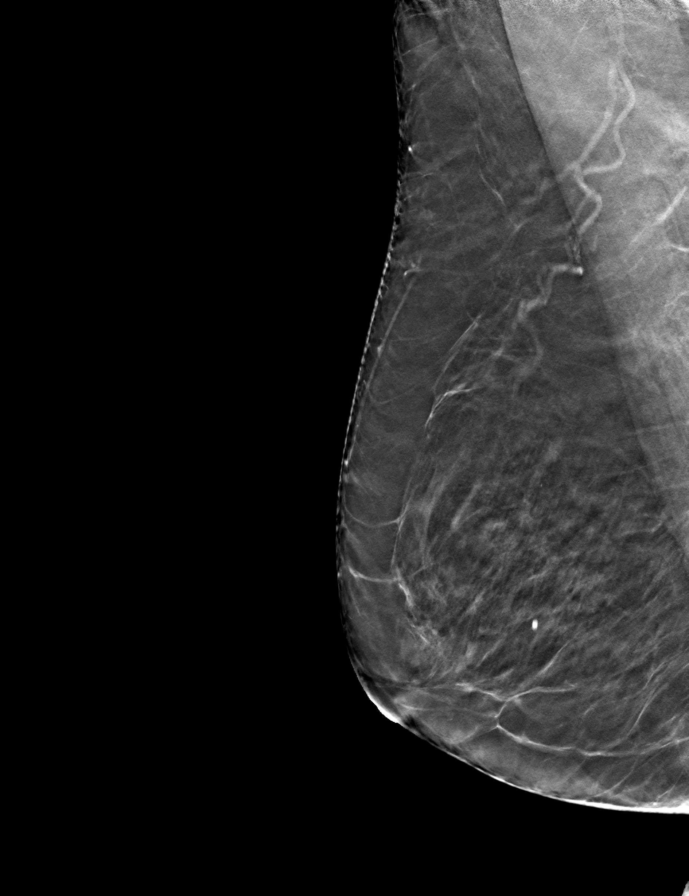

[R CC tomo · tomo slice 29/57.0]
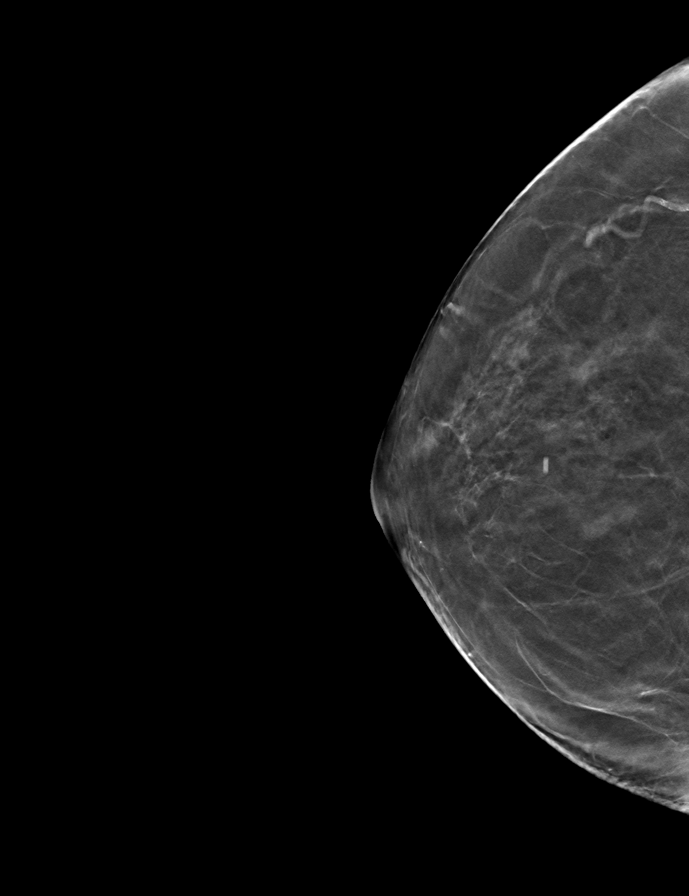

[L MLO tomo · tomo slice 30/59.0]
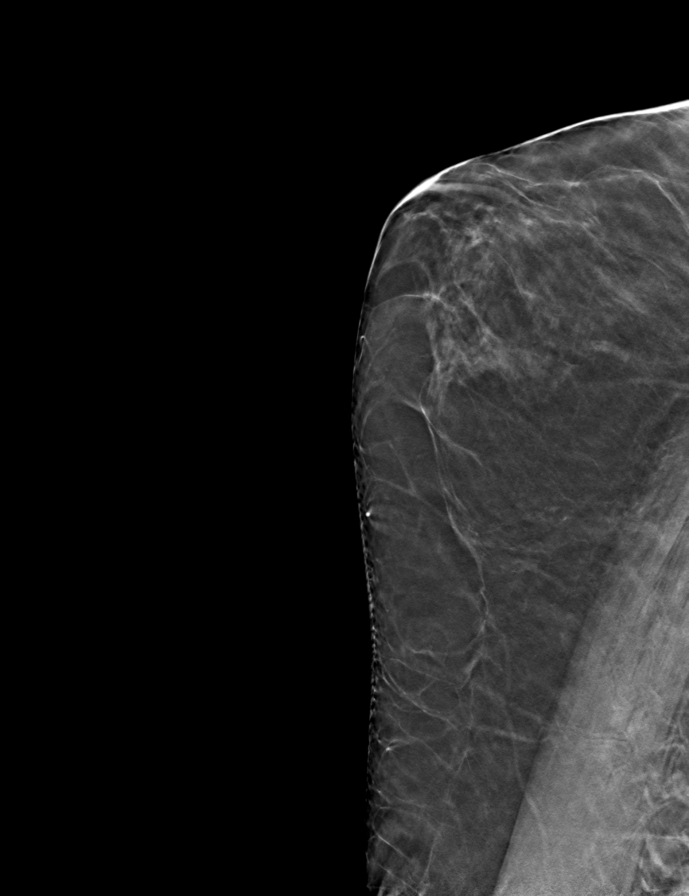

[9 of 24 positions shown; findings below may reference images not displayed]

ACR Breast Density Category b: There are scattered areas of
fibroglandular density.
FINDINGS: There are no findings suspicious for malignancy. Images were
processed with CAD.
IMPRESSION: No mammographic evidence of malignancy. A result letter of this
screening mammogram will be mailed directly to the patient.

RECOMMENDATION:
Screening mammogram in one year. (Code:[id number])

BI-RADS CATEGORY  1: Negative.

## 2022-06-13 DIAGNOSIS — Z79899 Other long term (current) drug therapy: Secondary | ICD-10-CM | POA: Diagnosis not present

## 2022-06-13 DIAGNOSIS — R202 Paresthesia of skin: Secondary | ICD-10-CM | POA: Diagnosis not present

## 2022-06-13 DIAGNOSIS — E7800 Pure hypercholesterolemia, unspecified: Secondary | ICD-10-CM | POA: Diagnosis not present

## 2022-06-20 DIAGNOSIS — D649 Anemia, unspecified: Secondary | ICD-10-CM | POA: Diagnosis not present

## 2022-06-20 DIAGNOSIS — R052 Subacute cough: Secondary | ICD-10-CM | POA: Diagnosis not present

## 2022-06-20 DIAGNOSIS — Z0000 Encounter for general adult medical examination without abnormal findings: Secondary | ICD-10-CM | POA: Diagnosis not present

## 2022-06-20 DIAGNOSIS — Z79899 Other long term (current) drug therapy: Secondary | ICD-10-CM | POA: Diagnosis not present

## 2022-06-20 DIAGNOSIS — N1831 Chronic kidney disease, stage 3a: Secondary | ICD-10-CM | POA: Diagnosis not present

## 2022-06-20 DIAGNOSIS — Z1331 Encounter for screening for depression: Secondary | ICD-10-CM | POA: Diagnosis not present

## 2022-06-21 ENCOUNTER — Ambulatory Visit (INDEPENDENT_AMBULATORY_CARE_PROVIDER_SITE_OTHER): Payer: HMO | Admitting: Urology

## 2022-06-21 VITALS — BP 163/89 | HR 103

## 2022-06-21 DIAGNOSIS — N952 Postmenopausal atrophic vaginitis: Secondary | ICD-10-CM

## 2022-06-21 DIAGNOSIS — Z87440 Personal history of urinary (tract) infections: Secondary | ICD-10-CM | POA: Diagnosis not present

## 2022-06-21 DIAGNOSIS — N390 Urinary tract infection, site not specified: Secondary | ICD-10-CM | POA: Diagnosis not present

## 2022-06-21 LAB — MICROSCOPIC EXAMINATION

## 2022-06-21 LAB — URINALYSIS, COMPLETE
Bilirubin, UA: NEGATIVE
Glucose, UA: NEGATIVE
Nitrite, UA: NEGATIVE
Protein,UA: NEGATIVE
RBC, UA: NEGATIVE
Specific Gravity, UA: 1.025 (ref 1.005–1.030)
Urobilinogen, Ur: 0.2 mg/dL (ref 0.2–1.0)
pH, UA: 5.5 (ref 5.0–7.5)

## 2022-06-21 NOTE — Patient Instructions (Signed)

## 2022-06-21 NOTE — Progress Notes (Signed)
   06/21/22  CC:  Chief Complaint  Patient presents with   Cysto    HPI: 89 yo F with recurrent UTI who presents today for f/u.    No issues since last visit.  Currently on short course of suppression abx and using estrogen cream as prescribed.    UA today negative.      RUS without hydro/ stones/ masses.    Blood pressure (!) 163/89, pulse (!) 103. NED. A&Ox3.   No respiratory distress   Abd soft, NT, ND Normal external genitalia with patent urethral meatus.  Periurethral atrophy appreciated.  Mild prolapse of anterior and apical vault.    Cystoscopy Procedure Note  Patient identification was confirmed, informed consent was obtained, and patient was prepped using Betadine solution.  Lidocaine jelly was administered per urethral meatus.    Procedure: - Flexible cystoscope introduced, without any difficulty.   - Thorough search of the bladder revealed:    normal urethral meatus    normal urothelium    no stones    no ulcers     no tumors    no urethral polyps    no trabeculation  - Ureteral orifices were normal in position and appearance.  Post-Procedure: - Patient tolerated the procedure well  Assessment/ Plan:  1. Recurrent UTI Anatomic work up negative other than mild prolapse and vaginal atrophy  Continue topical estrogen cream M-W-Fr as prescribed indefinitely  F/u prn - Urinalysis, Complete   Hollice Espy, MD

## 2022-06-27 DIAGNOSIS — K449 Diaphragmatic hernia without obstruction or gangrene: Secondary | ICD-10-CM | POA: Diagnosis not present

## 2022-06-27 DIAGNOSIS — R059 Cough, unspecified: Secondary | ICD-10-CM | POA: Diagnosis not present

## 2022-07-17 ENCOUNTER — Other Ambulatory Visit: Payer: Self-pay | Admitting: Urology

## 2022-07-17 DIAGNOSIS — N390 Urinary tract infection, site not specified: Secondary | ICD-10-CM

## 2022-08-24 DIAGNOSIS — H40003 Preglaucoma, unspecified, bilateral: Secondary | ICD-10-CM | POA: Diagnosis not present

## 2022-08-24 DIAGNOSIS — H43813 Vitreous degeneration, bilateral: Secondary | ICD-10-CM | POA: Diagnosis not present

## 2022-08-24 DIAGNOSIS — Z961 Presence of intraocular lens: Secondary | ICD-10-CM | POA: Diagnosis not present

## 2022-09-12 DIAGNOSIS — D649 Anemia, unspecified: Secondary | ICD-10-CM | POA: Diagnosis not present

## 2022-09-12 DIAGNOSIS — Z79899 Other long term (current) drug therapy: Secondary | ICD-10-CM | POA: Diagnosis not present

## 2022-09-19 DIAGNOSIS — D649 Anemia, unspecified: Secondary | ICD-10-CM | POA: Diagnosis not present

## 2022-09-19 DIAGNOSIS — E7800 Pure hypercholesterolemia, unspecified: Secondary | ICD-10-CM | POA: Diagnosis not present

## 2022-09-19 DIAGNOSIS — N1831 Chronic kidney disease, stage 3a: Secondary | ICD-10-CM | POA: Diagnosis not present

## 2022-09-19 DIAGNOSIS — Z79899 Other long term (current) drug therapy: Secondary | ICD-10-CM | POA: Diagnosis not present

## 2022-09-19 DIAGNOSIS — R052 Subacute cough: Secondary | ICD-10-CM | POA: Diagnosis not present

## 2022-09-20 DIAGNOSIS — D2261 Melanocytic nevi of right upper limb, including shoulder: Secondary | ICD-10-CM | POA: Diagnosis not present

## 2022-09-20 DIAGNOSIS — D2262 Melanocytic nevi of left upper limb, including shoulder: Secondary | ICD-10-CM | POA: Diagnosis not present

## 2022-09-20 DIAGNOSIS — L570 Actinic keratosis: Secondary | ICD-10-CM | POA: Diagnosis not present

## 2022-09-20 DIAGNOSIS — D2272 Melanocytic nevi of left lower limb, including hip: Secondary | ICD-10-CM | POA: Diagnosis not present

## 2022-09-20 DIAGNOSIS — D0472 Carcinoma in situ of skin of left lower limb, including hip: Secondary | ICD-10-CM | POA: Diagnosis not present

## 2022-09-20 DIAGNOSIS — D485 Neoplasm of uncertain behavior of skin: Secondary | ICD-10-CM | POA: Diagnosis not present

## 2022-09-20 DIAGNOSIS — D225 Melanocytic nevi of trunk: Secondary | ICD-10-CM | POA: Diagnosis not present

## 2022-09-20 DIAGNOSIS — R202 Paresthesia of skin: Secondary | ICD-10-CM | POA: Diagnosis not present

## 2022-09-20 DIAGNOSIS — D2271 Melanocytic nevi of right lower limb, including hip: Secondary | ICD-10-CM | POA: Diagnosis not present

## 2022-09-20 DIAGNOSIS — L821 Other seborrheic keratosis: Secondary | ICD-10-CM | POA: Diagnosis not present

## 2022-10-18 DIAGNOSIS — D0472 Carcinoma in situ of skin of left lower limb, including hip: Secondary | ICD-10-CM | POA: Diagnosis not present

## 2023-02-19 DIAGNOSIS — H40003 Preglaucoma, unspecified, bilateral: Secondary | ICD-10-CM | POA: Diagnosis not present

## 2023-02-26 DIAGNOSIS — H40003 Preglaucoma, unspecified, bilateral: Secondary | ICD-10-CM | POA: Diagnosis not present

## 2023-02-26 DIAGNOSIS — Z961 Presence of intraocular lens: Secondary | ICD-10-CM | POA: Diagnosis not present

## 2023-02-26 DIAGNOSIS — H18523 Epithelial (juvenile) corneal dystrophy, bilateral: Secondary | ICD-10-CM | POA: Diagnosis not present

## 2023-02-26 DIAGNOSIS — H04123 Dry eye syndrome of bilateral lacrimal glands: Secondary | ICD-10-CM | POA: Diagnosis not present

## 2023-03-08 DIAGNOSIS — D649 Anemia, unspecified: Secondary | ICD-10-CM | POA: Diagnosis not present

## 2023-03-08 DIAGNOSIS — N1831 Chronic kidney disease, stage 3a: Secondary | ICD-10-CM | POA: Diagnosis not present

## 2023-03-08 DIAGNOSIS — E7800 Pure hypercholesterolemia, unspecified: Secondary | ICD-10-CM | POA: Diagnosis not present

## 2023-03-08 DIAGNOSIS — Z79899 Other long term (current) drug therapy: Secondary | ICD-10-CM | POA: Diagnosis not present

## 2023-03-15 DIAGNOSIS — D649 Anemia, unspecified: Secondary | ICD-10-CM | POA: Diagnosis not present

## 2023-03-15 DIAGNOSIS — M1711 Unilateral primary osteoarthritis, right knee: Secondary | ICD-10-CM | POA: Diagnosis not present

## 2023-03-15 DIAGNOSIS — E7800 Pure hypercholesterolemia, unspecified: Secondary | ICD-10-CM | POA: Diagnosis not present

## 2023-03-15 DIAGNOSIS — R7303 Prediabetes: Secondary | ICD-10-CM | POA: Diagnosis not present

## 2023-03-15 DIAGNOSIS — Z79899 Other long term (current) drug therapy: Secondary | ICD-10-CM | POA: Diagnosis not present

## 2023-03-15 DIAGNOSIS — N1831 Chronic kidney disease, stage 3a: Secondary | ICD-10-CM | POA: Diagnosis not present

## 2023-04-26 DIAGNOSIS — N390 Urinary tract infection, site not specified: Secondary | ICD-10-CM | POA: Diagnosis not present

## 2023-04-26 DIAGNOSIS — R300 Dysuria: Secondary | ICD-10-CM | POA: Diagnosis not present

## 2023-04-26 DIAGNOSIS — R8290 Unspecified abnormal findings in urine: Secondary | ICD-10-CM | POA: Diagnosis not present

## 2023-05-04 DIAGNOSIS — D2272 Melanocytic nevi of left lower limb, including hip: Secondary | ICD-10-CM | POA: Diagnosis not present

## 2023-05-04 DIAGNOSIS — D2261 Melanocytic nevi of right upper limb, including shoulder: Secondary | ICD-10-CM | POA: Diagnosis not present

## 2023-05-04 DIAGNOSIS — D0461 Carcinoma in situ of skin of right upper limb, including shoulder: Secondary | ICD-10-CM | POA: Diagnosis not present

## 2023-05-04 DIAGNOSIS — D2262 Melanocytic nevi of left upper limb, including shoulder: Secondary | ICD-10-CM | POA: Diagnosis not present

## 2023-05-04 DIAGNOSIS — D485 Neoplasm of uncertain behavior of skin: Secondary | ICD-10-CM | POA: Diagnosis not present

## 2023-05-04 DIAGNOSIS — D225 Melanocytic nevi of trunk: Secondary | ICD-10-CM | POA: Diagnosis not present

## 2023-05-04 DIAGNOSIS — Z85828 Personal history of other malignant neoplasm of skin: Secondary | ICD-10-CM | POA: Diagnosis not present

## 2023-05-30 DIAGNOSIS — D0461 Carcinoma in situ of skin of right upper limb, including shoulder: Secondary | ICD-10-CM | POA: Diagnosis not present

## 2023-07-19 DIAGNOSIS — D0439 Carcinoma in situ of skin of other parts of face: Secondary | ICD-10-CM | POA: Diagnosis not present

## 2023-07-27 DIAGNOSIS — M7062 Trochanteric bursitis, left hip: Secondary | ICD-10-CM | POA: Diagnosis not present

## 2023-07-27 DIAGNOSIS — N1831 Chronic kidney disease, stage 3a: Secondary | ICD-10-CM | POA: Diagnosis not present

## 2023-07-27 DIAGNOSIS — M25561 Pain in right knee: Secondary | ICD-10-CM | POA: Diagnosis not present

## 2023-07-27 DIAGNOSIS — G8929 Other chronic pain: Secondary | ICD-10-CM | POA: Diagnosis not present

## 2023-07-27 DIAGNOSIS — R300 Dysuria: Secondary | ICD-10-CM | POA: Diagnosis not present

## 2023-07-27 DIAGNOSIS — N390 Urinary tract infection, site not specified: Secondary | ICD-10-CM | POA: Diagnosis not present

## 2023-08-28 DIAGNOSIS — R7303 Prediabetes: Secondary | ICD-10-CM | POA: Diagnosis not present

## 2023-08-28 DIAGNOSIS — H04123 Dry eye syndrome of bilateral lacrimal glands: Secondary | ICD-10-CM | POA: Diagnosis not present

## 2023-08-28 DIAGNOSIS — H18523 Epithelial (juvenile) corneal dystrophy, bilateral: Secondary | ICD-10-CM | POA: Diagnosis not present

## 2023-08-28 DIAGNOSIS — Z961 Presence of intraocular lens: Secondary | ICD-10-CM | POA: Diagnosis not present

## 2023-08-28 DIAGNOSIS — Z79899 Other long term (current) drug therapy: Secondary | ICD-10-CM | POA: Diagnosis not present

## 2023-08-28 DIAGNOSIS — E7800 Pure hypercholesterolemia, unspecified: Secondary | ICD-10-CM | POA: Diagnosis not present

## 2023-08-28 DIAGNOSIS — N1831 Chronic kidney disease, stage 3a: Secondary | ICD-10-CM | POA: Diagnosis not present

## 2023-08-28 DIAGNOSIS — H40003 Preglaucoma, unspecified, bilateral: Secondary | ICD-10-CM | POA: Diagnosis not present

## 2023-08-30 DIAGNOSIS — M1612 Unilateral primary osteoarthritis, left hip: Secondary | ICD-10-CM | POA: Diagnosis not present

## 2023-09-04 DIAGNOSIS — Z79899 Other long term (current) drug therapy: Secondary | ICD-10-CM | POA: Diagnosis not present

## 2023-09-04 DIAGNOSIS — N1831 Chronic kidney disease, stage 3a: Secondary | ICD-10-CM | POA: Diagnosis not present

## 2023-09-04 DIAGNOSIS — Z0000 Encounter for general adult medical examination without abnormal findings: Secondary | ICD-10-CM | POA: Diagnosis not present

## 2023-09-04 DIAGNOSIS — E7800 Pure hypercholesterolemia, unspecified: Secondary | ICD-10-CM | POA: Diagnosis not present

## 2023-09-04 DIAGNOSIS — R7303 Prediabetes: Secondary | ICD-10-CM | POA: Diagnosis not present

## 2023-09-11 DIAGNOSIS — M1711 Unilateral primary osteoarthritis, right knee: Secondary | ICD-10-CM | POA: Diagnosis not present

## 2023-09-20 DIAGNOSIS — M1612 Unilateral primary osteoarthritis, left hip: Secondary | ICD-10-CM | POA: Diagnosis not present

## 2023-09-20 DIAGNOSIS — M1711 Unilateral primary osteoarthritis, right knee: Secondary | ICD-10-CM | POA: Diagnosis not present

## 2023-09-27 DIAGNOSIS — M1612 Unilateral primary osteoarthritis, left hip: Secondary | ICD-10-CM | POA: Diagnosis not present

## 2023-09-27 DIAGNOSIS — M1711 Unilateral primary osteoarthritis, right knee: Secondary | ICD-10-CM | POA: Diagnosis not present

## 2023-10-04 DIAGNOSIS — M1612 Unilateral primary osteoarthritis, left hip: Secondary | ICD-10-CM | POA: Diagnosis not present

## 2023-10-04 DIAGNOSIS — M1711 Unilateral primary osteoarthritis, right knee: Secondary | ICD-10-CM | POA: Diagnosis not present

## 2023-10-11 DIAGNOSIS — M1612 Unilateral primary osteoarthritis, left hip: Secondary | ICD-10-CM | POA: Diagnosis not present

## 2023-10-11 DIAGNOSIS — M1711 Unilateral primary osteoarthritis, right knee: Secondary | ICD-10-CM | POA: Diagnosis not present

## 2023-10-18 DIAGNOSIS — M1612 Unilateral primary osteoarthritis, left hip: Secondary | ICD-10-CM | POA: Diagnosis not present

## 2023-10-18 DIAGNOSIS — M1711 Unilateral primary osteoarthritis, right knee: Secondary | ICD-10-CM | POA: Diagnosis not present

## 2023-10-25 DIAGNOSIS — N3001 Acute cystitis with hematuria: Secondary | ICD-10-CM | POA: Diagnosis not present

## 2023-10-25 DIAGNOSIS — R399 Unspecified symptoms and signs involving the genitourinary system: Secondary | ICD-10-CM | POA: Diagnosis not present

## 2023-11-06 DIAGNOSIS — R300 Dysuria: Secondary | ICD-10-CM | POA: Diagnosis not present

## 2023-11-06 DIAGNOSIS — N390 Urinary tract infection, site not specified: Secondary | ICD-10-CM | POA: Diagnosis not present

## 2023-11-15 DIAGNOSIS — M1711 Unilateral primary osteoarthritis, right knee: Secondary | ICD-10-CM | POA: Diagnosis not present

## 2023-11-15 DIAGNOSIS — M1612 Unilateral primary osteoarthritis, left hip: Secondary | ICD-10-CM | POA: Diagnosis not present

## 2023-11-22 DIAGNOSIS — M1612 Unilateral primary osteoarthritis, left hip: Secondary | ICD-10-CM | POA: Diagnosis not present

## 2023-11-22 DIAGNOSIS — M1711 Unilateral primary osteoarthritis, right knee: Secondary | ICD-10-CM | POA: Diagnosis not present

## 2023-11-29 DIAGNOSIS — M1711 Unilateral primary osteoarthritis, right knee: Secondary | ICD-10-CM | POA: Diagnosis not present

## 2023-11-29 DIAGNOSIS — M1612 Unilateral primary osteoarthritis, left hip: Secondary | ICD-10-CM | POA: Diagnosis not present

## 2023-12-06 DIAGNOSIS — M1711 Unilateral primary osteoarthritis, right knee: Secondary | ICD-10-CM | POA: Diagnosis not present

## 2023-12-06 DIAGNOSIS — M1612 Unilateral primary osteoarthritis, left hip: Secondary | ICD-10-CM | POA: Diagnosis not present

## 2023-12-13 DIAGNOSIS — M1612 Unilateral primary osteoarthritis, left hip: Secondary | ICD-10-CM | POA: Diagnosis not present

## 2023-12-13 DIAGNOSIS — M1711 Unilateral primary osteoarthritis, right knee: Secondary | ICD-10-CM | POA: Diagnosis not present

## 2023-12-20 DIAGNOSIS — M1612 Unilateral primary osteoarthritis, left hip: Secondary | ICD-10-CM | POA: Diagnosis not present

## 2023-12-20 DIAGNOSIS — M1711 Unilateral primary osteoarthritis, right knee: Secondary | ICD-10-CM | POA: Diagnosis not present

## 2023-12-21 DIAGNOSIS — L570 Actinic keratosis: Secondary | ICD-10-CM | POA: Diagnosis not present

## 2023-12-21 DIAGNOSIS — D225 Melanocytic nevi of trunk: Secondary | ICD-10-CM | POA: Diagnosis not present

## 2023-12-21 DIAGNOSIS — D2261 Melanocytic nevi of right upper limb, including shoulder: Secondary | ICD-10-CM | POA: Diagnosis not present

## 2023-12-21 DIAGNOSIS — L538 Other specified erythematous conditions: Secondary | ICD-10-CM | POA: Diagnosis not present

## 2023-12-21 DIAGNOSIS — D2262 Melanocytic nevi of left upper limb, including shoulder: Secondary | ICD-10-CM | POA: Diagnosis not present

## 2023-12-21 DIAGNOSIS — D2271 Melanocytic nevi of right lower limb, including hip: Secondary | ICD-10-CM | POA: Diagnosis not present

## 2023-12-21 DIAGNOSIS — D485 Neoplasm of uncertain behavior of skin: Secondary | ICD-10-CM | POA: Diagnosis not present

## 2023-12-21 DIAGNOSIS — L820 Inflamed seborrheic keratosis: Secondary | ICD-10-CM | POA: Diagnosis not present

## 2023-12-21 DIAGNOSIS — D2272 Melanocytic nevi of left lower limb, including hip: Secondary | ICD-10-CM | POA: Diagnosis not present

## 2023-12-21 DIAGNOSIS — D692 Other nonthrombocytopenic purpura: Secondary | ICD-10-CM | POA: Diagnosis not present

## 2023-12-21 DIAGNOSIS — D0471 Carcinoma in situ of skin of right lower limb, including hip: Secondary | ICD-10-CM | POA: Diagnosis not present

## 2023-12-21 DIAGNOSIS — L821 Other seborrheic keratosis: Secondary | ICD-10-CM | POA: Diagnosis not present

## 2024-01-03 DIAGNOSIS — M1711 Unilateral primary osteoarthritis, right knee: Secondary | ICD-10-CM | POA: Diagnosis not present

## 2024-01-03 DIAGNOSIS — M1612 Unilateral primary osteoarthritis, left hip: Secondary | ICD-10-CM | POA: Diagnosis not present

## 2024-01-10 DIAGNOSIS — M1711 Unilateral primary osteoarthritis, right knee: Secondary | ICD-10-CM | POA: Diagnosis not present

## 2024-01-10 DIAGNOSIS — M1612 Unilateral primary osteoarthritis, left hip: Secondary | ICD-10-CM | POA: Diagnosis not present

## 2024-01-31 DIAGNOSIS — M1612 Unilateral primary osteoarthritis, left hip: Secondary | ICD-10-CM | POA: Diagnosis not present

## 2024-01-31 DIAGNOSIS — M1711 Unilateral primary osteoarthritis, right knee: Secondary | ICD-10-CM | POA: Diagnosis not present

## 2024-02-14 DIAGNOSIS — M1612 Unilateral primary osteoarthritis, left hip: Secondary | ICD-10-CM | POA: Diagnosis not present

## 2024-02-14 DIAGNOSIS — M1711 Unilateral primary osteoarthritis, right knee: Secondary | ICD-10-CM | POA: Diagnosis not present

## 2024-02-18 DIAGNOSIS — N1831 Chronic kidney disease, stage 3a: Secondary | ICD-10-CM | POA: Diagnosis not present

## 2024-02-18 DIAGNOSIS — Z79899 Other long term (current) drug therapy: Secondary | ICD-10-CM | POA: Diagnosis not present

## 2024-02-18 DIAGNOSIS — E7800 Pure hypercholesterolemia, unspecified: Secondary | ICD-10-CM | POA: Diagnosis not present

## 2024-02-18 DIAGNOSIS — R7303 Prediabetes: Secondary | ICD-10-CM | POA: Diagnosis not present

## 2024-02-25 DIAGNOSIS — E7800 Pure hypercholesterolemia, unspecified: Secondary | ICD-10-CM | POA: Diagnosis not present

## 2024-02-25 DIAGNOSIS — M810 Age-related osteoporosis without current pathological fracture: Secondary | ICD-10-CM | POA: Diagnosis not present

## 2024-02-25 DIAGNOSIS — Z79899 Other long term (current) drug therapy: Secondary | ICD-10-CM | POA: Diagnosis not present

## 2024-02-25 DIAGNOSIS — R7303 Prediabetes: Secondary | ICD-10-CM | POA: Diagnosis not present

## 2024-02-25 DIAGNOSIS — N1831 Chronic kidney disease, stage 3a: Secondary | ICD-10-CM | POA: Diagnosis not present

## 2024-02-25 DIAGNOSIS — D649 Anemia, unspecified: Secondary | ICD-10-CM | POA: Diagnosis not present

## 2024-02-27 DIAGNOSIS — D0471 Carcinoma in situ of skin of right lower limb, including hip: Secondary | ICD-10-CM | POA: Diagnosis not present

## 2024-02-28 DIAGNOSIS — M1612 Unilateral primary osteoarthritis, left hip: Secondary | ICD-10-CM | POA: Diagnosis not present

## 2024-02-28 DIAGNOSIS — M1711 Unilateral primary osteoarthritis, right knee: Secondary | ICD-10-CM | POA: Diagnosis not present

## 2024-02-28 DIAGNOSIS — H40003 Preglaucoma, unspecified, bilateral: Secondary | ICD-10-CM | POA: Diagnosis not present

## 2024-03-05 DIAGNOSIS — M810 Age-related osteoporosis without current pathological fracture: Secondary | ICD-10-CM | POA: Diagnosis not present

## 2024-03-06 DIAGNOSIS — M1612 Unilateral primary osteoarthritis, left hip: Secondary | ICD-10-CM | POA: Diagnosis not present

## 2024-03-06 DIAGNOSIS — M1711 Unilateral primary osteoarthritis, right knee: Secondary | ICD-10-CM | POA: Diagnosis not present

## 2024-03-06 DIAGNOSIS — H40003 Preglaucoma, unspecified, bilateral: Secondary | ICD-10-CM | POA: Diagnosis not present

## 2024-03-06 DIAGNOSIS — H04123 Dry eye syndrome of bilateral lacrimal glands: Secondary | ICD-10-CM | POA: Diagnosis not present

## 2024-03-13 DIAGNOSIS — M1711 Unilateral primary osteoarthritis, right knee: Secondary | ICD-10-CM | POA: Diagnosis not present

## 2024-03-13 DIAGNOSIS — M1612 Unilateral primary osteoarthritis, left hip: Secondary | ICD-10-CM | POA: Diagnosis not present

## 2024-03-27 DIAGNOSIS — M1612 Unilateral primary osteoarthritis, left hip: Secondary | ICD-10-CM | POA: Diagnosis not present

## 2024-03-27 DIAGNOSIS — M1711 Unilateral primary osteoarthritis, right knee: Secondary | ICD-10-CM | POA: Diagnosis not present

## 2024-04-03 DIAGNOSIS — M1711 Unilateral primary osteoarthritis, right knee: Secondary | ICD-10-CM | POA: Diagnosis not present

## 2024-04-03 DIAGNOSIS — M1612 Unilateral primary osteoarthritis, left hip: Secondary | ICD-10-CM | POA: Diagnosis not present
